# Patient Record
Sex: Male | Born: 1980 | Race: White | Hispanic: No | Marital: Single | Smoking: Former smoker
Health system: Southern US, Community
[De-identification: ages and names within clinical notes are randomized; demographics above are authoritative.]

## PROBLEM LIST (undated history)

## (undated) DIAGNOSIS — Z720 Tobacco use: Secondary | ICD-10-CM

## (undated) HISTORY — DX: Tobacco use: Z72.0

---

## 2018-02-01 ENCOUNTER — Encounter (HOSPITAL_BASED_OUTPATIENT_CLINIC_OR_DEPARTMENT_OTHER): Payer: Self-pay | Admitting: *Deleted

## 2018-02-01 ENCOUNTER — Other Ambulatory Visit: Payer: Self-pay

## 2018-02-01 ENCOUNTER — Emergency Department (HOSPITAL_BASED_OUTPATIENT_CLINIC_OR_DEPARTMENT_OTHER)
Admission: EM | Admit: 2018-02-01 | Discharge: 2018-02-02 | Disposition: A | Discharge status: Home / Self Care | Payer: Self-pay | Attending: Emergency Medicine | Admitting: Emergency Medicine

## 2018-02-01 DIAGNOSIS — R0602 Shortness of breath: Secondary | ICD-10-CM | POA: Insufficient documentation

## 2018-02-01 DIAGNOSIS — R55 Syncope and collapse: Secondary | ICD-10-CM | POA: Insufficient documentation

## 2018-02-01 DIAGNOSIS — F17210 Nicotine dependence, cigarettes, uncomplicated: Secondary | ICD-10-CM | POA: Insufficient documentation

## 2018-02-01 NOTE — ED Triage Notes (Signed)
Pt reports that he has had recurrent issues with getting lightheaded, SOB when getting hot x 1 year. Reports that he gets hot and then he gets lightheaded and SOB.  No acute distress noted.  States that he was seen and was dx with anxiety and panic attacks. Reports left arm tingling during episode.

## 2018-02-02 ENCOUNTER — Emergency Department (HOSPITAL_BASED_OUTPATIENT_CLINIC_OR_DEPARTMENT_OTHER): Payer: Self-pay

## 2018-02-02 LAB — CBC WITH DIFFERENTIAL/PLATELET
BASOS ABS: 0 10*3/uL (ref 0.0–0.1)
BASOS PCT: 0 %
Eosinophils Absolute: 0.3 10*3/uL (ref 0.0–0.7)
Eosinophils Relative: 3 %
HEMATOCRIT: 49 % (ref 39.0–52.0)
HEMOGLOBIN: 17.3 g/dL — AB (ref 13.0–17.0)
Lymphocytes Relative: 32 %
Lymphs Abs: 4.3 10*3/uL — ABNORMAL HIGH (ref 0.7–4.0)
MCH: 30 pg (ref 26.0–34.0)
MCHC: 35.3 g/dL (ref 30.0–36.0)
MCV: 85.1 fL (ref 78.0–100.0)
MONOS PCT: 7 %
Monocytes Absolute: 0.9 10*3/uL (ref 0.1–1.0)
NEUTROS ABS: 7.8 10*3/uL — AB (ref 1.7–7.7)
NEUTROS PCT: 58 %
Platelets: 225 10*3/uL (ref 150–400)
RBC: 5.76 MIL/uL (ref 4.22–5.81)
RDW: 13.2 % (ref 11.5–15.5)
WBC: 13.4 10*3/uL — ABNORMAL HIGH (ref 4.0–10.5)

## 2018-02-02 LAB — BASIC METABOLIC PANEL
ANION GAP: 9 (ref 5–15)
BUN: 13 mg/dL (ref 6–20)
CHLORIDE: 106 mmol/L (ref 101–111)
CO2: 23 mmol/L (ref 22–32)
Calcium: 9.1 mg/dL (ref 8.9–10.3)
Creatinine, Ser: 0.78 mg/dL (ref 0.61–1.24)
GFR calc non Af Amer: >60 mL/min (ref >60)
Glucose, Bld: 98 mg/dL (ref 65–99)
Potassium: 4.1 mmol/L (ref 3.5–5.1)
Sodium: 138 mmol/L (ref 135–145)

## 2018-02-02 NOTE — ED Notes (Signed)
Ambulatory back from xray, steady gait. Alert, NAD, calm, interactive, resps e/u, speaking in clear complete sentences, no dyspnea noted, skin W&D, VSS, reports earlier episode of "felt hot and sob", (denies: pain, sob, nausea, dizziness or visual changes at this time). [facility]

## 2018-02-02 NOTE — Discharge Instructions (Addendum)
You were seen today for episodes of almost passing out.  Your workup is reassuring.  Make sure to stay hydrated.  If you begin to feel hot or short of breath, you need to sit down.  Avoid getting overheated.

## 2018-02-02 NOTE — ED Provider Notes (Signed)
[facility] EMERGENCY DEPARTMENT Provider Note   CSN: [id number] Arrival date & time: 02/01/18  2147     History   Chief Complaint Chief Complaint  Patient presents with  . Shortness of Breath    HPI Ronald Allen is a 37 y.o. male.  HPI  This is a 37 year old male this is a 37 year old male who presents with near syncope.  Patient reports that he was at work tonight when he began to feel dizzy and hot.  He then felt lightheaded.  He states when he got this way he felt short of breath.  Symptoms have since resolved.  He has had multiple episodes like this in the past when he gets hot.  He denies passing out.  He denies chest pain.  He states that he has previously been told it might be "related to anxiety."  However, patient denies any feelings of anxiety at this time.  Denies weakness, numbness, strokelike symptoms.  Denies any recent illnesses or fevers.  History reviewed. No pertinent past medical history.  There are no active problems to display for this patient.   History reviewed. No pertinent surgical history.      Home Medications    Prior to Admission medications   Not on File    Family History History reviewed. No pertinent family history.  Social History Social History   Tobacco Use  . Smoking status: Current Every Day Smoker    Packs/day: 1.00    Types: Cigarettes  Substance Use Topics  . Alcohol use: Not Currently  . Drug use: Not Currently     Allergies   Patient has no known allergies.   Review of Systems Review of Systems  Constitutional: Negative for fever.  Respiratory: Positive for shortness of breath.   Cardiovascular: Negative for chest pain.  Gastrointestinal: Negative for abdominal pain, nausea and vomiting.  Genitourinary: Negative for dysuria.  Neurological: Positive for dizziness.  All other systems reviewed and are negative.    Physical Exam Updated Vital Signs BP (!) 124/59   Pulse 66   Temp 98 F (36.7 C)  (Oral)   Resp 20   Ht 5' 10.5" (1.791 m)   Wt 97.5 kg (215 lb)   SpO2 100%   BMI 30.41 kg/m    Physical Exam  Constitutional: He is oriented to person, place, and time. He appears well-developed and well-nourished.  HENT:  Head: Normocephalic and atraumatic.  Eyes: Pupils are equal, round, and reactive to light. EOM are normal.  Cardiovascular: Normal rate, regular rhythm and normal heart sounds.  No murmur heard. Pulmonary/Chest: Effort normal and breath sounds normal. No respiratory distress. He has no wheezes.  Abdominal: Soft. Bowel sounds are normal. There is no tenderness. There is no rebound.  Musculoskeletal: He exhibits no edema.  Neurological: He is alert and oriented to person, place, and time.  Cranial nerves II through XII intact, 5 out of 5 strength in all 4 extremities, no dysmetria to finger-nose-finger  Skin: Skin is warm and dry.  Psychiatric: He has a normal mood and affect.  Nursing note and vitals reviewed.    ED Treatments / Results  Labs (all labs ordered are listed, but only abnormal results are displayed) Labs Reviewed  CBC WITH DIFFERENTIAL/PLATELET - Abnormal; Notable for the following components:      Result Value   WBC 13.4 (*)    Hemoglobin 17.3 (*)    Neutro Abs 7.8 (*)    Lymphs Abs 4.3 (*)    All  other components within normal limits  BASIC METABOLIC PANEL    EKG EKG Interpretation  Date/Time:  Monday February 02 2018 00:38:33 EDT Ventricular Rate:  72 PR Interval:    QRS Duration: 94 QT Interval:  376 QTC Calculation: 412 R Axis:   40 Text Interpretation:  Sinus rhythm Low voltage, precordial leads Anteroseptal infarct, old no prior for comparison Confirmed by Ross Marcus ([id number]) on 02/02/2018 1:14:07 AM   Radiology Dg Chest 2 View  Result Date: 02/02/2018 CLINICAL DATA:  Acute onset of generalized chest tightness and shortness of breath. EXAM: CHEST - 2 VIEW COMPARISON:  None. FINDINGS: The lungs are well-aerated and clear.  There is no evidence of focal opacification, pleural effusion or pneumothorax. The heart is normal in size; the mediastinal contour is within normal limits. No acute osseous abnormalities are seen. IMPRESSION: No acute cardiopulmonary process seen. Electronically Signed   By: Roanna Raider M.D.   On: 02/02/2018 00:24    Procedures Procedures (including critical care time)  Medications Ordered in ED Medications - No data to display   Initial Impression / Assessment and Plan / ED Course  I have reviewed the triage vital signs and the nursing notes.  Pertinent labs & imaging results that were available during my care of the patient were reviewed by me and considered in my medical decision making (see chart for details).     Patient presents with near syncope.  Nontoxic appearing.  VS reassuring.  Neurologically intact.  EKG shows no evidence of arrythmia.  CBC without evidence of anemia.  He is not orthostatic.  Suspect vasovagal etiology.  Recommend hydration and avoiding getting overheated.  After history, exam, and medical workup I feel the patient has been appropriately medically screened and is safe for discharge home. Pertinent diagnoses were discussed with the patient. Patient was given return precautions.   Final Clinical Impressions(s) / ED Diagnoses   Final diagnoses:  Near syncope    ED Discharge Orders    None      Horton, Mayer Masker, MD 02/02/18 [phone]

## 2018-02-02 NOTE — ED Notes (Signed)
Alert, NAD, calm, interactive, resting comfortably[facility].

## 2018-02-23 ENCOUNTER — Institutional Professional Consult (permissible substitution): Payer: Self-pay | Admitting: Pulmonary Disease

## 2018-02-26 ENCOUNTER — Institutional Professional Consult (permissible substitution): Payer: Self-pay | Admitting: Pulmonary Disease

## 2018-05-19 ENCOUNTER — Emergency Department (HOSPITAL_BASED_OUTPATIENT_CLINIC_OR_DEPARTMENT_OTHER): Payer: Commercial Managed Care - PPO

## 2018-05-19 ENCOUNTER — Emergency Department (HOSPITAL_BASED_OUTPATIENT_CLINIC_OR_DEPARTMENT_OTHER)
Admission: EM | Admit: 2018-05-19 | Discharge: 2018-05-20 | Disposition: A | Discharge status: Home / Self Care | Payer: Commercial Managed Care - PPO | Attending: Emergency Medicine | Admitting: Emergency Medicine

## 2018-05-19 DIAGNOSIS — F17210 Nicotine dependence, cigarettes, uncomplicated: Secondary | ICD-10-CM | POA: Insufficient documentation

## 2018-05-19 DIAGNOSIS — R0602 Shortness of breath: Secondary | ICD-10-CM | POA: Diagnosis not present

## 2018-05-19 DIAGNOSIS — I10 Essential (primary) hypertension: Secondary | ICD-10-CM | POA: Diagnosis present

## 2018-05-19 NOTE — ED Triage Notes (Signed)
Pt also c/o brief episode of chest pain

## 2018-05-19 NOTE — ED Triage Notes (Signed)
Pt states he checked his blood pressure at work and got high reading of 160s- 170s systolic. Pt also c/o of a headache which has resolved. He also stated he has "breathing Troubles". Pt describes it as not being able to take a deep breath. NAD noted in triage

## 2018-05-19 NOTE — ED Notes (Signed)
Patient transported to X-ray 

## 2018-05-20 LAB — BASIC METABOLIC PANEL
ANION GAP: 9 (ref 5–15)
BUN: 10 mg/dL (ref 6–20)
CALCIUM: 8.9 mg/dL (ref 8.9–10.3)
CO2: 24 mmol/L (ref 22–32)
Chloride: 103 mmol/L (ref 98–111)
Creatinine, Ser: 0.93 mg/dL (ref 0.61–1.24)
GFR calc non Af Amer: >60 mL/min (ref >60)
GLUCOSE: 95 mg/dL (ref 70–99)
Potassium: 4.4 mmol/L (ref 3.5–5.1)
Sodium: 136 mmol/L (ref 135–145)

## 2018-05-20 LAB — CBC
HCT: 45.4 % (ref 39.0–52.0)
HEMOGLOBIN: 16.2 g/dL (ref 13.0–17.0)
MCH: 30 pg (ref 26.0–34.0)
MCHC: 35.7 g/dL (ref 30.0–36.0)
MCV: 84.1 fL (ref 78.0–100.0)
PLATELETS: 168 10*3/uL (ref 150–400)
RBC: 5.4 MIL/uL (ref 4.22–5.81)
RDW: 13.3 % (ref 11.5–15.5)
WBC: 10 10*3/uL (ref 4.0–10.5)

## 2018-05-20 LAB — TROPONIN I: Troponin I: <0.03 ng/mL (ref <0.03)

## 2018-05-20 NOTE — ED Provider Notes (Signed)
[facility] EMERGENCY DEPARTMENT Provider Note   CSN: [id number] Arrival date & time: 05/19/18  2304     History   Chief Complaint Chief Complaint  Patient presents with  . Hypertension  . Shortness of Breath    HPI Ronald Allen is a 37 y.o. male.  HPI  This is a 37 year old male with no reported past medical history who presents with concerns for high blood pressure.  Patient reports that he had a headache earlier today.  He has a wrist blood pressure monitor.  Noted his blood pressures in the 160s to 170s.  He took his blood pressure repeatedly over 30 minutes and it continued to go upwards.  This concerned him.  Currently he is without any symptoms.  Denies headache.  He reports 1 year history of shortness of breath.  He stopped smoking when he initially had shortness of breath.  He denies any chest pain.  Denies any leg swelling or history of blood clots.  Denies any fevers or recent infectious symptoms.  No past medical history on file.  There are no active problems to display for this patient.   No past surgical history on file.      Home Medications    Prior to Admission medications   Not on File    Family History No family history on file.  Social History Social History   Tobacco Use  . Smoking status: Current Every Day Smoker    Packs/day: 1.00    Types: Cigarettes  Substance Use Topics  . Alcohol use: Not Currently  . Drug use: Not Currently     Allergies   Patient has no known allergies.   Review of Systems Review of Systems  Constitutional: Negative for fever.  Respiratory: Positive for shortness of breath. Negative for cough.   Cardiovascular: Negative for chest pain and leg swelling.  Gastrointestinal: Negative for abdominal pain, nausea and vomiting.  Neurological: Negative for headaches.  All other systems reviewed and are negative.    Physical Exam Updated Vital Signs BP 123/87   Pulse 70   Temp 97.6 F (36.4 C)  (Oral)   Ht 5\' 10"  (1.778 m)   Wt 99.8 kg (220 lb)   SpO2 100%   BMI 31.57 kg/m   Physical Exam  Constitutional: He is oriented to person, place, and time. He appears well-developed and well-nourished.  HENT:  Head: Normocephalic and atraumatic.  Eyes: Pupils are equal, round, and reactive to light.  Neck: Neck supple.  Cardiovascular: Normal rate, regular rhythm and normal heart sounds.  No murmur heard. Pulmonary/Chest: Effort normal and breath sounds normal. No respiratory distress. He has no wheezes.  Abdominal: Soft. Bowel sounds are normal. There is no tenderness. There is no rebound.  Musculoskeletal: He exhibits no edema.  Lymphadenopathy:    He has no cervical adenopathy.  Neurological: He is alert and oriented to person, place, and time.  Skin: Skin is warm and dry.  Psychiatric: He has a normal mood and affect.  Nursing note and vitals reviewed.    ED Treatments / Results  Labs (all labs ordered are listed, but only abnormal results are displayed) Labs Reviewed  BASIC METABOLIC PANEL  CBC  TROPONIN I    EKG EKG Interpretation  Date/Time:  Tuesday May 19 2018 23:42:07 EDT Ventricular Rate:  70 PR Interval:    QRS Duration: 99 QT Interval:  385 QTC Calculation: 416 R Axis:   48 Text Interpretation:  Sinus rhythm Confirmed by Ross MarcusHorton, Caprina Wussow (  [id number]) on 05/19/2018 11:44:58 PM   Radiology Dg Chest 2 View  Result Date: 05/19/2018 CLINICAL DATA:  Chest pain and short of breath EXAM: CHEST - 2 VIEW COMPARISON:  02/02/2018 FINDINGS: The heart size and mediastinal contours are within normal limits. Both lungs are clear. Mild degenerative changes of the spine. IMPRESSION: No active cardiopulmonary disease. Electronically Signed   By: Jasmine Pang M.D.   On: 05/19/2018 23:53    Procedures Procedures (including critical care time)  Medications Ordered in ED Medications - No data to display   Initial Impression / Assessment and Plan / ED Course  I have  reviewed the triage vital signs and the nursing notes.  Pertinent labs & imaging results that were available during my care of the patient were reviewed by me and considered in my medical decision making (see chart for details).     Patient presents with concerns for high blood pressure.  He is overall nontoxic-appearing.Marland Kitchen  His blood pressure has been in normal range.  Initial blood pressure 123/87.  He reports no acute symptoms but does report chronic shortness of breath.  He reports having work-ups in the past.  He has stopped smoking.  He is in no respiratory distress.  O2 sats 91%.  No wheezing on exam.  Chest x-ray without pneumothorax or pneumonia.  EKG is nonischemic.  Work-up is largely reassuring. He is PERC neg doubt PE.  Recommend follow-up with PCP for ongoing chronic issue.  Doubt acute medical problem.  After history, exam, and medical workup I feel the patient has been appropriately medically screened and is safe for discharge home. Pertinent diagnoses were discussed with the patient. Patient was given return precautions.   Final Clinical Impressions(s) / ED Diagnoses   Final diagnoses:  Shortness of breath    ED Discharge Orders    None       Shon Baton, MD 05/20/18 [phone]

## 2018-05-20 NOTE — Discharge Instructions (Addendum)
You were seen today for evaluation of high blood pressure.  Your blood pressures here have been within normal range.  You need to see if your blood pressure monitor is calibrated correctly.  You have had ongoing shortness of breath.  Your x-ray is negative and your work-up is reassuring.  Follow-up closely with your primary physician for ongoing work-up.

## 2019-01-21 ENCOUNTER — Emergency Department
Admission: EM | Admit: 2019-01-21 | Discharge: 2019-01-21 | Disposition: A | Discharge status: Home / Self Care | Payer: BLUE CROSS/BLUE SHIELD | Attending: Emergency Medicine | Admitting: Emergency Medicine

## 2019-01-21 ENCOUNTER — Encounter: Payer: Self-pay | Admitting: Emergency Medicine

## 2019-01-21 ENCOUNTER — Other Ambulatory Visit: Payer: Self-pay

## 2019-01-21 DIAGNOSIS — R202 Paresthesia of skin: Secondary | ICD-10-CM | POA: Insufficient documentation

## 2019-01-21 DIAGNOSIS — Z87891 Personal history of nicotine dependence: Secondary | ICD-10-CM | POA: Diagnosis not present

## 2019-01-21 DIAGNOSIS — F410 Panic disorder [episodic paroxysmal anxiety] without agoraphobia: Secondary | ICD-10-CM | POA: Diagnosis not present

## 2019-01-21 DIAGNOSIS — R0602 Shortness of breath: Secondary | ICD-10-CM | POA: Diagnosis present

## 2019-01-21 DIAGNOSIS — R0789 Other chest pain: Secondary | ICD-10-CM | POA: Insufficient documentation

## 2019-01-21 NOTE — ED Triage Notes (Signed)
Pt here with c/o shortness of breath while at work this am, fatigue and dry throat. States while sitting in car he developed chest pain and tingling in his left arm-feels this could be related to anxiety. Denies cough or fever. NAD.

## 2019-01-21 NOTE — ED Notes (Signed)
Pt appears anxious, states he has a hx of the same. Denies tingling in his left arm at this time.

## 2019-01-21 NOTE — Discharge Instructions (Signed)
Your exam is reassuring at this time. You have symptoms that may represent a panic attack, causing shortness of breath and chest tightness. You are encouraged to follow-up with your provider or return to the ED as needed.

## 2019-01-21 NOTE — ED Provider Notes (Signed)
[facility] Emergency Department Provider Note  ____________________________________________   First MD Initiated Contact with Patient 01/21/19 1211     (approximate)  I have reviewed the triage vital signs and the nursing notes.   HISTORY  Chief Complaint Shortness of Breath and Chest Pain  HPI Ronald Allen is a 38 y.o. 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   He presents himself to the ED for evaluation of now resolved shortness of breath while at work this morning.  Patient describes awakening this morning in his normal state of health, and had breakfast without incident.  He also describes having a large 32 ounce cup of hot coffee.  He describes to sit in his car when he began to develop some chest tightness, with deep inspiration.  He also describes some feelings of tingling in his left arm.  He denies any sweating, nausea, vomiting, cough, or syncope.  He describes similar symptoms in the past, and has had work-ups for the same.  He is apparently been told that he has some anxiety after his work-ups were negative.  Patient denies any cough, fever, recent travel, or high risk exposures.  Patient reports the symptoms are completely resolved during the time of his triage and evaluation.  History reviewed. No pertinent past medical history.  There are no active problems to display for this patient.  History reviewed. No pertinent surgical history.  Prior to Admission medications   Not on File    Allergies Patient has no known allergies.  No family history on file.  Social History Social History   Tobacco Use  . Smoking status: Former Smoker    Packs/day: 1.00    Types: Cigarettes  . Smokeless tobacco: Never Used  . Tobacco comment: quit a year ago this month  Substance Use Topics  . Alcohol use: Not Currently  . Drug use: Not Currently    Review of Systems Constitutional: Denies fever, chills, or sweats. ENT: Denies  sore throat, otalgia, or sinus congestion. Cardiovascular: No chest pain. Respiratory: Reports shortness of breath Musculoskeletal: Negative for neck pain nor stiffness. Integumentary: Negative for rash. Neurological: No focal weakness nor numbness. ____________________________________________   PHYSICAL EXAM:  VITAL SIGNS: ED Triage Vitals  Enc Vitals Group     BP 01/21/19 1219 (!) 143/83     Pulse Rate 01/21/19 1213 96     Resp 01/21/19 1213 20     Temp 01/21/19 1217 98.3 F (36.8 C)     Temp Source 01/21/19 1213 Oral     SpO2 01/21/19 1213 100 %     Weight 01/21/19 1215 220 lb (99.8 kg)     Height 01/21/19 1215 5\' 10"  (1.778 m)     Head Circumference --      Peak Flow --      Pain Score 01/21/19 1214 1     Pain Loc --      Pain Edu? --      Excl. in GC? --     Constitutional: Alert and oriented. Generally well appearing and in no acute distress. Eyes: Conjunctivae are normal.  Neck: No stridor.  No meningeal signs.   Cardiovascular: Grossly normal heart sounds.  No murmurs, rubs, or gallops. Respiratory: No respiratory distress, no adventitious lung sounds. Musculoskeletal: No lower extremity tenderness nor edema. No gross deformities of extremities. Neurologic:  Normal speech and language. No gross focal neurologic deficits are appreciated.  Skin:  Skin is warm, dry and intact. No rash  noted.  No diaphoresis ____________________________________________   LABS (all labs ordered are listed, but only abnormal results are displayed)  Labs Reviewed - No data to display  ____________________________________________  RADIOLOGY  Official radiology report(s): No results found.  ____________________________________________  INITIAL IMPRESSION / MDM / ASSESSMENT AND PLAN / ED COURSE  As part of my medical decision making, I reviewed the following data within the electronic medical record.  Patient with ED evaluation of now resolved symptoms of shortness of breath  and some inspiratory chest tightness.  Patient is at this time stable without signs of any acute respiratory distress, acute coronary syndrome, or neurological deficits.  Patient was offered further evaluation related to his symptoms to confirm suspected diagnosis of anxiety related, noncardiac chest pain.  Patient verbalized his symptom relief with solution, and his assessment and agreement that her symptoms are likely related to an anxiety component point.  He is not inclined to proceed with further evaluation at this time.  We discussed again the only way to clearly rule out a cardiac cause would be with routine testing as he had received in the last 8 months.  Patient was given strict return precautions, including initiating the EMS for cardiac related complaints.  Patient verbalized understanding and is discharged to his care at this time to follow-up or return as discussed.  Patient's vital signs are stable.  This patient currently does not meet criteria for testing and I explained that in detail to the patient.  The evaluation today is reassuring with no evidence of emergent medical condition that requires further work-up or evaluation or inpatient treatment.  I provided follow-up recommendations and strict return precautions.  The patient understands and agrees with the plan. ____________________________________________  FINAL CLINICAL IMPRESSION(S) / ED DIAGNOSES  Final diagnoses:  Shortness of breath  Anxiety attack        Note:  This document was prepared using Dragon voice recognition software and may include unintentional dictation errors.    Lissa Hoard, PA-C 01/21/19 2010    Don Perking, [place], MD 01/22/19 [phone]

## 2019-08-10 ENCOUNTER — Other Ambulatory Visit: Payer: Self-pay

## 2019-08-10 ENCOUNTER — Encounter (HOSPITAL_COMMUNITY): Payer: Self-pay | Admitting: Emergency Medicine

## 2019-08-10 ENCOUNTER — Emergency Department (HOSPITAL_COMMUNITY): Payer: BC Managed Care – PPO

## 2019-08-10 DIAGNOSIS — Z87891 Personal history of nicotine dependence: Secondary | ICD-10-CM | POA: Diagnosis not present

## 2019-08-10 DIAGNOSIS — R002 Palpitations: Secondary | ICD-10-CM | POA: Diagnosis not present

## 2019-08-10 DIAGNOSIS — R0789 Other chest pain: Secondary | ICD-10-CM | POA: Diagnosis present

## 2019-08-10 LAB — CBC
HCT: 49.2 % (ref 39.0–52.0)
Hemoglobin: 16.6 g/dL (ref 13.0–17.0)
MCH: 29 pg (ref 26.0–34.0)
MCHC: 33.7 g/dL (ref 30.0–36.0)
MCV: 86 fL (ref 80.0–100.0)
Platelets: 297 10*3/uL (ref 150–400)
RBC: 5.72 MIL/uL (ref 4.22–5.81)
RDW: 12.6 % (ref 11.5–15.5)
WBC: 12.2 10*3/uL — ABNORMAL HIGH (ref 4.0–10.5)
nRBC: 0 % (ref 0.0–0.2)

## 2019-08-10 LAB — BASIC METABOLIC PANEL
Anion gap: 8 (ref 5–15)
BUN: 12 mg/dL (ref 6–20)
CO2: 26 mmol/L (ref 22–32)
Calcium: 9.3 mg/dL (ref 8.9–10.3)
Chloride: 101 mmol/L (ref 98–111)
Creatinine, Ser: 0.9 mg/dL (ref 0.61–1.24)
GFR calc Af Amer: >60 mL/min (ref >60)
GFR calc non Af Amer: >60 mL/min (ref >60)
Glucose, Bld: 140 mg/dL — ABNORMAL HIGH (ref 70–99)
Potassium: 3.5 mmol/L (ref 3.5–5.1)
Sodium: 135 mmol/L (ref 135–145)

## 2019-08-10 LAB — TROPONIN I (HIGH SENSITIVITY): Troponin I (High Sensitivity): <2 ng/L (ref <18)

## 2019-08-10 NOTE — ED Triage Notes (Signed)
Pt arrives via POV w/complaints of chest pain that started at 0730, pt states his heart was pounding, he was breathing heavy & he became sweaty. Pt denies radiating chest pain. Pt states he became nauseous after the episode. Pt states he was diagnosed w/vertigo on Monday.

## 2019-08-11 ENCOUNTER — Emergency Department (HOSPITAL_COMMUNITY)
Admission: EM | Admit: 2019-08-11 | Discharge: 2019-08-11 | Disposition: A | Discharge status: Home / Self Care | Payer: BC Managed Care – PPO | Attending: Emergency Medicine | Admitting: Emergency Medicine

## 2019-08-11 DIAGNOSIS — R0789 Other chest pain: Secondary | ICD-10-CM

## 2019-08-11 DIAGNOSIS — R002 Palpitations: Secondary | ICD-10-CM

## 2019-08-11 LAB — TROPONIN I (HIGH SENSITIVITY): Troponin I (High Sensitivity): <2 ng/L (ref <18)

## 2019-08-11 NOTE — Discharge Instructions (Signed)
Please call the cardiologist's office here at the hospital toget an appointment to be evaluated by the cardiologist for your episodes of racing heart.  Drink plenty of fluids.  Avoid caffeine products.  Recheck if you feel worse.

## 2019-08-11 NOTE — ED Provider Notes (Signed)
[facility] EMERGENCY DEPARTMENT Provider Note   CSN: [id number] Arrival date & time: 08/10/19  1959   Time seen 2:36 AM  History   Chief Complaint Chief Complaint  Patient presents with  . Chest Pain    HPI Juliet Rudedwin Gola is a 38 y.o. male.     HPI patient states he got off work about 7:30 AM on October 20.  He states he "had an attack".  He felt like his heart was racing and he felt short of breath.  He got nauseated and diaphoretic.  He had hot flashes.  He states he drove himself to the ED and sat in the parking lot and was able to calm himself down.  He states the episode lasted about 35 minutes.  He states however he has not felt right the rest of the day.  He states he feels very fatigued.  He has had chest pain in the center of his chest since 730 yesterday morning.  He states changing positions makes it hurt, nothing makes it feel better.  He denies cough but he has had shortness of breath off and on mainly with exertion.  He denies wheezing, fever, or chills.  He has had nausea without vomiting or diarrhea.  He states things are tasting funny but he has normal smell.Marland Kitchen.  He states he has been having episodes like this before in the past year.  He states he can go several months and then he will have episodes frequently.  He states tonight was the longest he has ever had it in the first time he had lingering chest pain after it.  He states he had a Covid test done last week that was negative because he had lost his since of smell and taste.  Family history he is unaware of any coronary artery disease or cardiac problems in his family.  Patient denies drinking caffeine products.  PCP Patient, No Pcp Per   History reviewed. No pertinent past medical history.  There are no active problems to display for this patient.   History reviewed. No pertinent surgical history.      Home Medications    Prior to Admission medications   Not on File    Family History History reviewed.  No pertinent family history.  Social History Social History   Tobacco Use  . Smoking status: Former Smoker    Packs/day: 1.00    Types: Cigarettes  . Smokeless tobacco: Never Used  . Tobacco comment: quit a year ago this month  Substance Use Topics  . Alcohol use: Not Currently  . Drug use: Not Currently  employed Quit smoking 1 year ago  Allergies   Patient has no known allergies.   Review of Systems Review of Systems  All other systems reviewed and are negative.    Physical Exam Updated Vital Signs BP 105/72 (BP Location: Right Arm)   Pulse 70   Temp 98.2 F (36.8 C) (Oral)   Resp 18   Ht 5' 10.5" (1.791 m)   Wt 97.5 kg   SpO2 99%   BMI 30.41 kg/m   Physical Exam Vitals signs and nursing note reviewed.  Constitutional:      General: He is not in acute distress.    Appearance: Normal appearance. He is well-developed. He is not ill-appearing or toxic-appearing.  HENT:     Head: Normocephalic and atraumatic.     Right Ear: External ear normal.     Left Ear: External ear normal.  Nose: Nose normal. No mucosal edema or rhinorrhea.     Mouth/Throat:     Dentition: No dental abscesses.     Pharynx: No uvula swelling.  Eyes:     Conjunctiva/sclera: Conjunctivae normal.     Pupils: Pupils are equal, round, and reactive to light.  Neck:     Musculoskeletal: Full passive range of motion without pain, normal range of motion and neck supple.  Cardiovascular:     Rate and Rhythm: Normal rate and regular rhythm.     Heart sounds: Normal heart sounds. No murmur. No friction rub. No gallop.   Pulmonary:     Effort: Pulmonary effort is normal. No respiratory distress.     Breath sounds: Normal breath sounds. No wheezing, rhonchi or rales.  Chest:     Chest wall: No tenderness or crepitus.       Comments: Area of chest pain noted Abdominal:     General: Bowel sounds are normal. There is no distension.     Palpations: Abdomen is soft.     Tenderness: There is  no abdominal tenderness. There is no guarding or rebound.  Musculoskeletal: Normal range of motion.        General: No tenderness.     Comments: Moves all extremities well.   Skin:    General: Skin is warm and dry.     Coloration: Skin is not pale.     Findings: No erythema or rash.  Neurological:     Mental Status: He is alert and oriented to person, place, and time.     Cranial Nerves: No cranial nerve deficit.  Psychiatric:        Mood and Affect: Mood is not anxious.        Speech: Speech normal.        Behavior: Behavior normal.      ED Treatments / Results  Labs (all labs ordered are listed, but only abnormal results are displayed) Results for orders placed or performed during the hospital encounter of 08/11/19  Basic metabolic panel  Result Value Ref Range   Sodium 135 135 - 145 mmol/L   Potassium 3.5 3.5 - 5.1 mmol/L   Chloride 101 98 - 111 mmol/L   CO2 26 22 - 32 mmol/L   Glucose, Bld 140 (H) 70 - 99 mg/dL   BUN 12 6 - 20 mg/dL   Creatinine, Ser 7.51 0.61 - 1.24 mg/dL   Calcium 9.3 8.9 - 70.0 mg/dL   GFR calc non Af Amer >60 >60 mL/min   GFR calc Af Amer >60 >60 mL/min   Anion gap 8 5 - 15  CBC  Result Value Ref Range   WBC 12.2 (H) 4.0 - 10.5 K/uL   RBC 5.72 4.22 - 5.81 MIL/uL   Hemoglobin 16.6 13.0 - 17.0 g/dL   HCT 17.4 94.4 - 96.7 %   MCV 86.0 80.0 - 100.0 fL   MCH 29.0 26.0 - 34.0 pg   MCHC 33.7 30.0 - 36.0 g/dL   RDW 59.1 63.8 - 46.6 %   Platelets 297 150 - 400 K/uL   nRBC 0.0 0.0 - 0.2 %  Troponin I (High Sensitivity)  Result Value Ref Range   Troponin I (High Sensitivity) <2.0 <18 ng/L  Troponin I (High Sensitivity)  Result Value Ref Range   Troponin I (High Sensitivity) <2.0 <18 ng/L   Laboratory interpretation all normal except mild leukocytosis, mild hyperglycemia and nonfasting patient, delta troponin are normal    EKG EKG Interpretation  Date/Time:  Tuesday August 10 2019 20:16:52 EDT Ventricular Rate:  91 PR Interval:  178 QRS  Duration: 106 QT Interval:  364 QTC Calculation: 447 R Axis:   -10 Text Interpretation:  Normal sinus rhythm Low voltage QRS Incomplete right bundle branch block Cannot rule out Anteroseptal infarct , age undetermined No significant change since last tracing 19 May 2018 Confirmed by Rolland Porter [phone]) on 08/11/2019 1:06:21 AM   Radiology Dg Chest 2 View  Result Date: 08/10/2019 CLINICAL DATA:  Chest pain EXAM: CHEST - 2 VIEW COMPARISON:  Radiograph 05/19/2018, 02/02/2018 FINDINGS: Mild airways thickening. No consolidation, features of edema, pneumothorax, or effusion. Pulmonary vascularity is normally distributed. The cardiomediastinal contours are unremarkable. No acute osseous or soft tissue abnormality. IMPRESSION: Mild airways thickening could reflect bronchitis or reactive airways disease. No other acute cardiopulmonary abnormality. Electronically Signed   By: Lovena Le M.D.   On: 08/10/2019 20:41    Procedures Procedures (including critical care time)  Medications Ordered in ED Medications - No data to display   Initial Impression / Assessment and Plan / ED Course  I have reviewed the triage vital signs and the nursing notes.  Pertinent labs & imaging results that were available during my care of the patient were reviewed by me and considered in my medical decision making (see chart for details).   Patient has an appointment next week with a specialist about vertigo.   Patient describes episodes intermittently of palpitations.  I am going to refer him to cardiology.  His laboratory testing tonight is nonrevealing.  Final Clinical Impressions(s) / ED Diagnoses   Final diagnoses:  Palpitations  Atypical chest pain    ED Discharge Orders    None     Plan discharge  Rolland Porter, MD, Barbette Or, MD 08/11/19 [phone]

## 2020-02-19 ENCOUNTER — Encounter (HOSPITAL_BASED_OUTPATIENT_CLINIC_OR_DEPARTMENT_OTHER): Payer: Self-pay | Admitting: Emergency Medicine

## 2020-02-19 ENCOUNTER — Emergency Department (HOSPITAL_BASED_OUTPATIENT_CLINIC_OR_DEPARTMENT_OTHER)
Admission: EM | Admit: 2020-02-19 | Discharge: 2020-02-19 | Disposition: A | Discharge status: Home / Self Care | Payer: Commercial Managed Care - PPO | Attending: Emergency Medicine | Admitting: Emergency Medicine

## 2020-02-19 ENCOUNTER — Other Ambulatory Visit: Payer: Self-pay

## 2020-02-19 DIAGNOSIS — R42 Dizziness and giddiness: Secondary | ICD-10-CM | POA: Insufficient documentation

## 2020-02-19 DIAGNOSIS — Z20822 Contact with and (suspected) exposure to covid-19: Secondary | ICD-10-CM | POA: Diagnosis not present

## 2020-02-19 DIAGNOSIS — Z87891 Personal history of nicotine dependence: Secondary | ICD-10-CM | POA: Insufficient documentation

## 2020-02-19 LAB — BASIC METABOLIC PANEL
Anion gap: 12 (ref 5–15)
BUN: 16 mg/dL (ref 6–20)
CO2: 25 mmol/L (ref 22–32)
Calcium: 9.5 mg/dL (ref 8.9–10.3)
Chloride: 101 mmol/L (ref 98–111)
Creatinine, Ser: 0.91 mg/dL (ref 0.61–1.24)
GFR calc Af Amer: >60 mL/min (ref >60)
GFR calc non Af Amer: >60 mL/min (ref >60)
Glucose, Bld: 93 mg/dL (ref 70–99)
Potassium: 3.8 mmol/L (ref 3.5–5.1)
Sodium: 138 mmol/L (ref 135–145)

## 2020-02-19 LAB — CBC
HCT: 47.3 % (ref 39.0–52.0)
Hemoglobin: 16.6 g/dL (ref 13.0–17.0)
MCH: 29.3 pg (ref 26.0–34.0)
MCHC: 35.1 g/dL (ref 30.0–36.0)
MCV: 83.4 fL (ref 80.0–100.0)
Platelets: 254 10*3/uL (ref 150–400)
RBC: 5.67 MIL/uL (ref 4.22–5.81)
RDW: 12.8 % (ref 11.5–15.5)
WBC: 9.4 10*3/uL (ref 4.0–10.5)
nRBC: 0 % (ref 0.0–0.2)

## 2020-02-19 LAB — SARS CORONAVIRUS 2 AG (30 MIN TAT): SARS Coronavirus 2 Ag: NEGATIVE

## 2020-02-19 MED ORDER — SODIUM CHLORIDE 0.9 % IV BOLUS
1000.0000 mL | Freq: Once | INTRAVENOUS | Status: AC
  Administered 2020-02-19: 1000 mL via INTRAVENOUS

## 2020-02-19 NOTE — ED Triage Notes (Signed)
Dizziness with movement x 1 week. Denies pain.

## 2020-02-19 NOTE — ED Notes (Signed)
Pt discharged to home. Discharge instructions have been discussed with patient and/or family members. Pt verbally acknowledges understanding d/c instructions, and endorses comprehension to checkout at registration before leaving.  °

## 2020-02-19 NOTE — ED Provider Notes (Signed)
[facility] EMERGENCY DEPARTMENT Provider Note  CSN: [id number] Arrival date & time: 02/19/20  5462     History Chief Complaint  Patient presents with  . Dizziness    Ronald Allen is a 39 y.o. male with no significant past medical history presents ED with complaint of lightheadedness.  Patient reports he is noted his symptoms for approximately 2 weeks.  He said it was gradual onset initially.  But he has noted more persistent symptoms of lightheadedness.  These are associated mostly with activity but also occur while completely at rest.  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  He said he felt like he had "brain fog and dizziness" and had to stop what he was doing.  He denies syncope.  He denies having chest pain or palpitations.   He has never had prior episodes like this in the past.  No neck pain, fever or headaches. He DENIES vertigo symptoms, but states his lightheadedness sometimes worsens when he "moves too quickly."  He denies personal hx of MI or cardiac disease.  Mother had MI at later age.  No family hx of sudden death.  He quit smoking 2 years ago.  Does not drink alcohol or use any recreational drugs including THC.  NKDA He has not received COVID vaccination yet.  No hemoptysis or asymmetric LE edema. Patient denies personal or family history of DVT or PE. No recent hormone use (including OCP); travel for >6 hours; prolonged immobilization for greater than 3 days; surgeries or trauma in the last 4 weeks; or malignancy with treatment within 6 months.   HPI     History reviewed. No pertinent past medical history.  There are no problems to display for this patient.   History reviewed. No pertinent surgical history.     No family history on file.  Social History   Tobacco Use  . Smoking status: Former Smoker    Packs/day: 1.00    Types: Cigarettes  . Smokeless tobacco: Never Used  .  Tobacco comment: quit a year ago this month  Substance Use Topics  . Alcohol use: Not Currently  . Drug use: Not Currently    Home Medications Prior to Admission medications   Not on File    Allergies    Patient has no known allergies.  Review of Systems   Review of Systems  Constitutional: Negative for chills and fever.  Eyes: Negative for pain and visual disturbance.  Respiratory: Negative for cough and shortness of breath.   Cardiovascular: Negative for chest pain and palpitations.  Gastrointestinal: Negative for abdominal pain, nausea and vomiting.  Musculoskeletal: Negative for arthralgias, neck pain and neck stiffness.  Skin: Negative for rash and wound.  Neurological: Positive for dizziness and light-headedness. Negative for seizures, syncope, speech difficulty, weakness, numbness and headaches.  Psychiatric/Behavioral: Negative for agitation and confusion.  All other systems reviewed and are negative.   Physical Exam Updated Vital Signs BP 118/73 (BP Location: Right Arm)   Pulse 72   Temp 97.6 F (36.4 C)   Resp 18   Ht 5\' 10"  (1.778 m)   Wt 94.3 kg   SpO2 99%   BMI 29.84 kg/m   Physical Exam Vitals and nursing note reviewed.  Constitutional:      Appearance: He is well-developed.  HENT:     Head: Normocephalic and atraumatic.  Eyes:     Conjunctiva/sclera: Conjunctivae normal.  Cardiovascular:     Rate and Rhythm: Normal rate and regular rhythm.     Pulses: Normal pulses.  Pulmonary:     Effort: Pulmonary effort is normal. No respiratory distress.     Breath sounds: Normal breath sounds.  Abdominal:     Palpations: Abdomen is soft.     Tenderness: There is no abdominal tenderness.  Musculoskeletal:     Cervical back: Neck supple.  Skin:    General: Skin is warm and dry.  Neurological:     General: No focal deficit present.     Mental Status: He is alert and oriented to person, place, and time. Mental status is at baseline.     Cranial Nerves:  No cranial nerve deficit.     Sensory: Sensory deficit present.     Gait: Gait normal.  Psychiatric:        Mood and Affect: Mood normal.        Behavior: Behavior normal.     ED Results / Procedures / Treatments   Labs (all labs ordered are listed, but only abnormal results are displayed) Labs Reviewed  SARS CORONAVIRUS 2 AG (30 MIN TAT)  BASIC METABOLIC PANEL  CBC    EKG EKG Interpretation  Date/Time:  Saturday Feb 19 2020 08:28:41 EDT Ventricular Rate:  72 PR Interval:    QRS Duration: 99 QT Interval:  379 QTC Calculation: 415 R Axis:   20 Text Interpretation: Sinus rhythm Low voltage, precordial leads No STEMI Confirmed by Octaviano Glow [id number]) on 02/19/2020 8:48:05 AM   Radiology No results found.  Procedures Procedures (including critical care time)  Medications Ordered in ED Medications  sodium chloride 0.9 % bolus 1,000 mL (0 mLs Intravenous Stopped 02/19/20 1028)    ED Course  I have reviewed the triage vital signs and the nursing notes.  Pertinent labs & imaging results that were available during my care of the patient were reviewed by me and considered in my medical decision making (see chart for details).  39 year old male presenting to the ED with episodes of lightheadedness for the past 2 weeks.    He has a benign cardiopulmonary and neurological exam today.  No nystagmus.  No neck pain or history of trauma to suggest vascular dissection.   Without persistent symptoms or any clear risk factors, this seems less likely a posterior stroke or cerebellar infarct.  No evidence of arrhythmia on ecg here, but cannot exclude the possibility.  Will check electrolytes. Will also check CBC for anemia ECG shows NSR, no sign of heart block or prolonged QTc or channelopathy on this ECG.  No STEMI or evidence of ischemia.  He is PERC negative, with no tachycardia or active respiratory symptoms.  I think this is less likely a PE.  Ronald Allen was evaluated in  Emergency Department on 02/19/2020 for the symptoms described in the history of present illness. He was evaluated in the context of the global COVID-19 pandemic, which necessitated consideration that the patient might be at risk for infection with the SARS-CoV-2 virus that causes COVID-19. Institutional protocols and algorithms that pertain to the evaluation of patients at risk for COVID-19 are in a state of rapid change based on information released by regulatory bodies including the CDC and federal and state organizations. These policies and algorithms were followed during the patient's care in the ED.   Clinical Course as of Feb 19 1632  Sat Feb 19, 2020  0940 Chemistry machine is not functioning - we'll obtain an  istat chem 8   [MT]  1132 Patient is feeling much better after his fluids.  He is asymptomatic. I reviewed his lab tests and ECG with him.  He tells to me that he has been taking a "anti-parasite" medication he bought OTC for about a week.  He asks if this could be causing his symptoms.  I told him yes, certainly possible.  Many over-the-counter home remedies or medications are not FDA regulated, and is not clear what may be in this medicine.  Advised him to stop taking this substance and monitor symptoms.   [MT]    Clinical Course User Index [MT] Ricquel Foulk, Kermit Balo, MD    Final Clinical Impression(s) / ED Diagnoses Final diagnoses:  Lightheadedness    Rx / DC Orders ED Discharge Orders    None       Terald Sleeper, MD 02/19/20 [phone]

## 2021-07-01 IMAGING — DX DG CHEST 2V
2 series · 2 of 2 positions shown · non-contrast
Comparison: Radiograph 05/19/2018, 02/02/2018

CLINICAL DATA: Chest pain

EXAM:
CHEST - 2 VIEW

[chest pa]
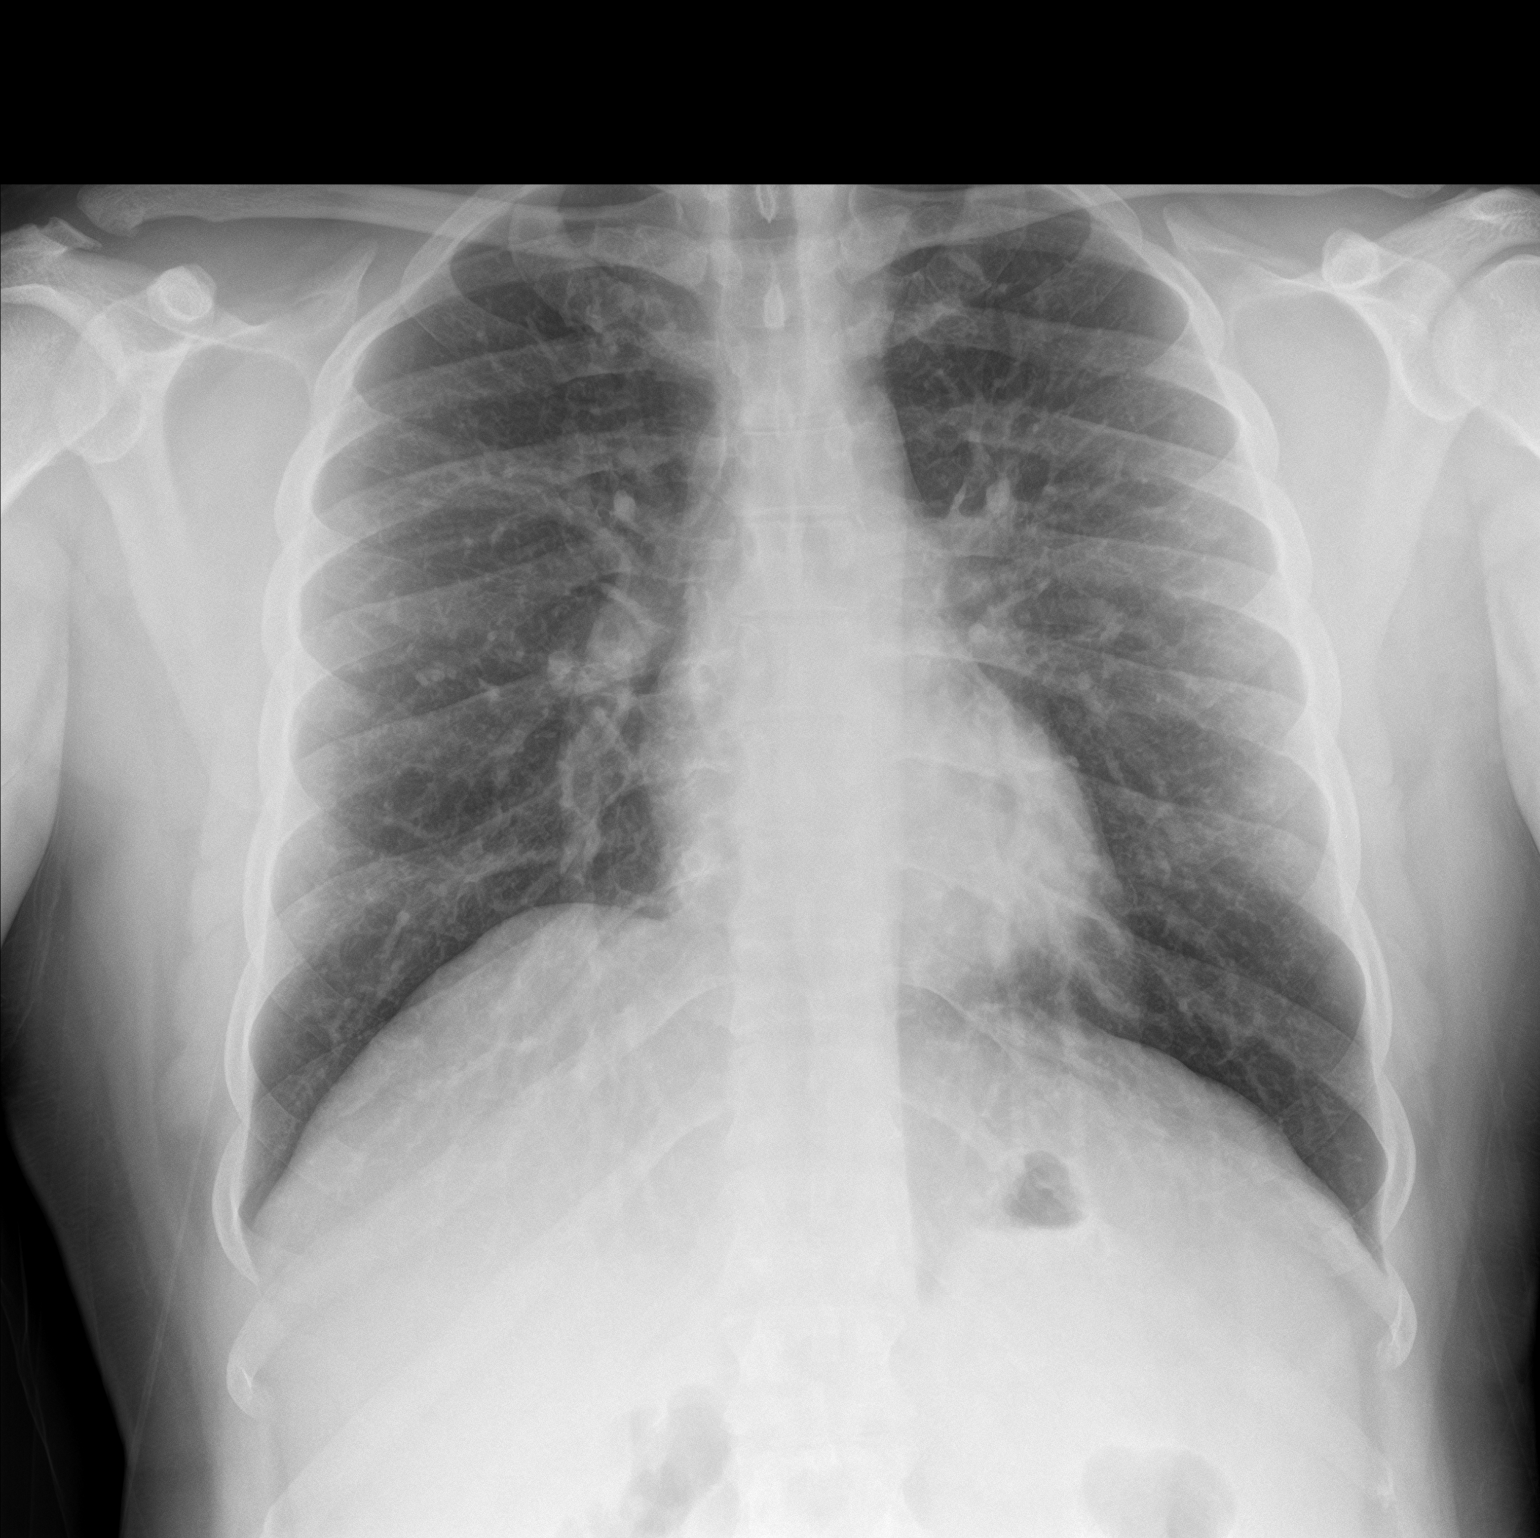

[chest lat]
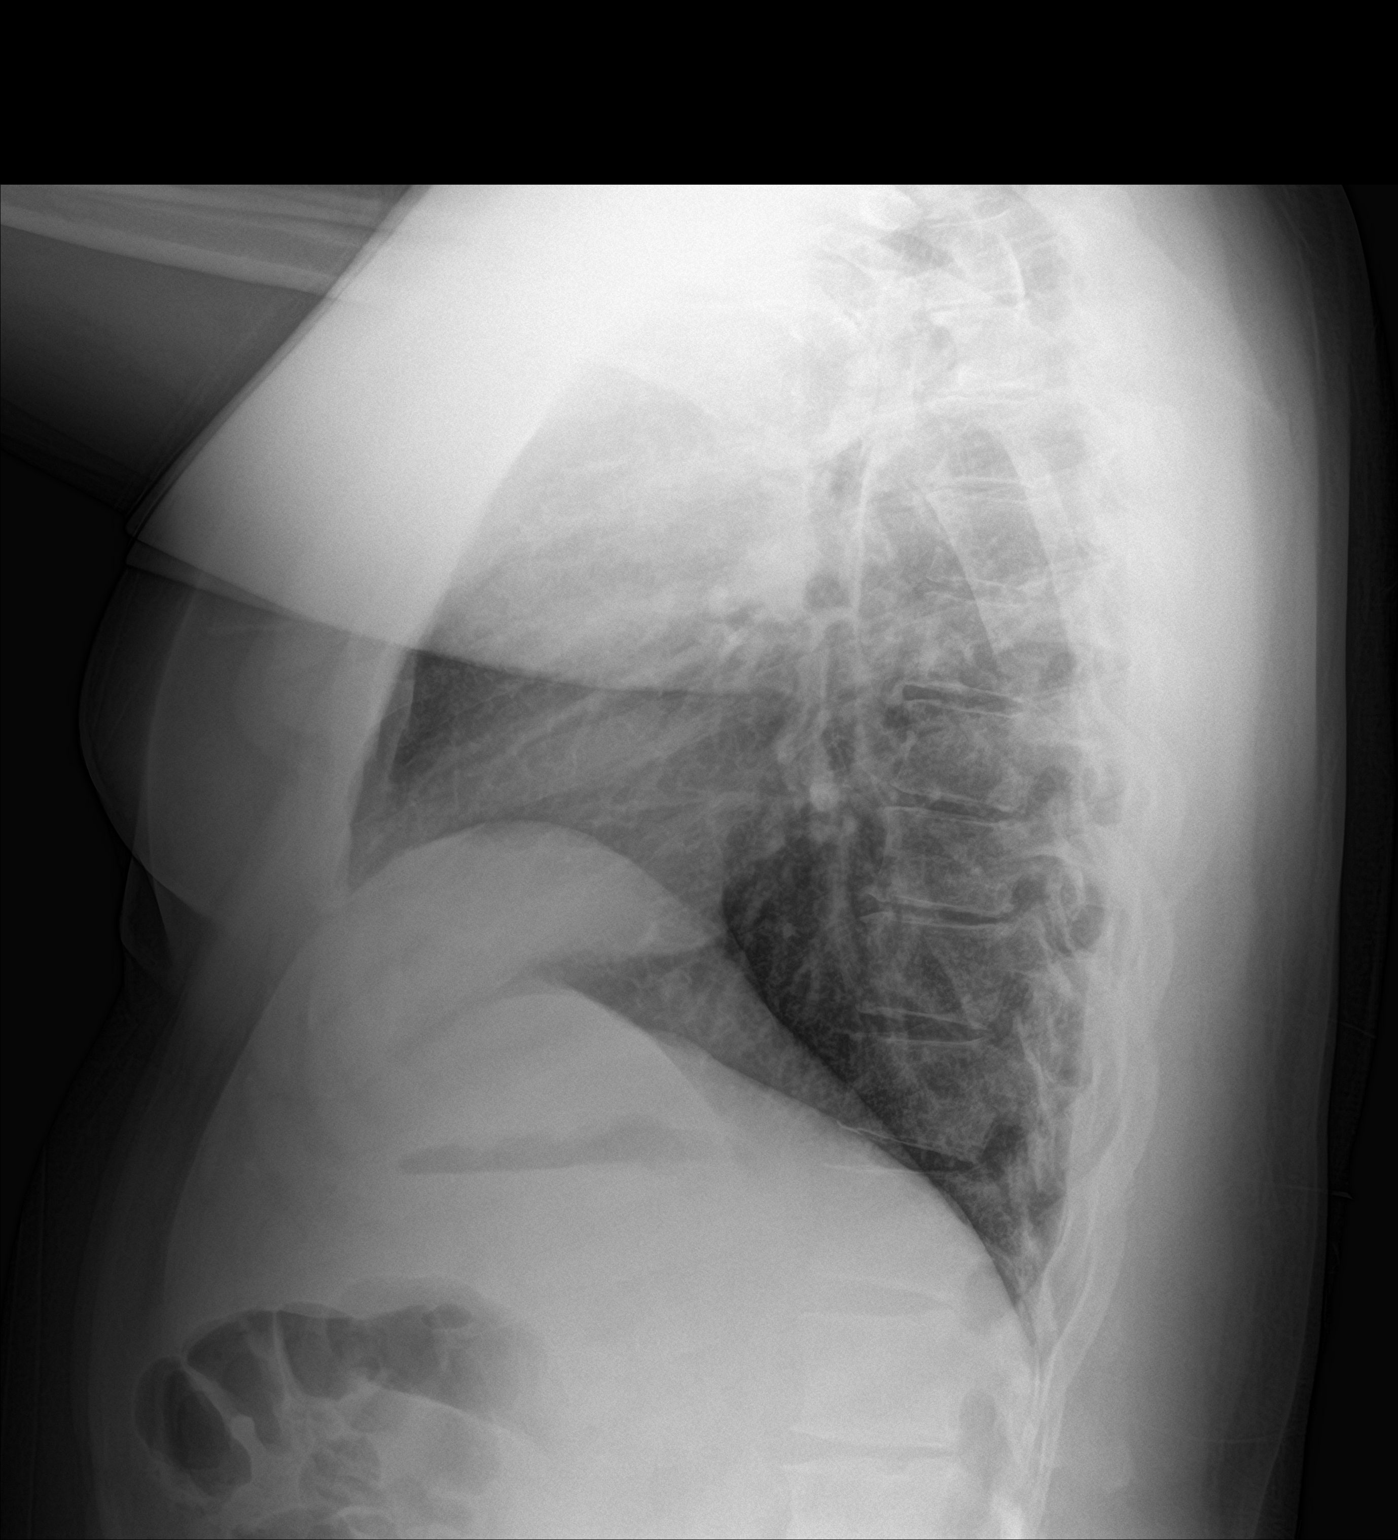

[2 of 2 positions shown; findings below may reference images not displayed]

FINDINGS: Mild airways thickening. No consolidation, features of edema,
pneumothorax, or effusion. Pulmonary vascularity is normally
distributed. The cardiomediastinal contours are unremarkable. No
acute osseous or soft tissue abnormality.
IMPRESSION: Mild airways thickening could reflect bronchitis or reactive airways
disease.

No other acute cardiopulmonary abnormality.

## 2022-02-16 ENCOUNTER — Emergency Department: Payer: BC Managed Care – PPO

## 2022-02-16 ENCOUNTER — Encounter: Payer: Self-pay | Admitting: Emergency Medicine

## 2022-02-16 ENCOUNTER — Other Ambulatory Visit: Payer: Self-pay

## 2022-02-16 ENCOUNTER — Emergency Department
Admission: EM | Admit: 2022-02-16 | Discharge: 2022-02-17 | Disposition: A | Discharge status: Home / Self Care | Payer: BC Managed Care – PPO | Attending: Emergency Medicine | Admitting: Emergency Medicine

## 2022-02-16 DIAGNOSIS — R0789 Other chest pain: Secondary | ICD-10-CM | POA: Insufficient documentation

## 2022-02-16 DIAGNOSIS — R0602 Shortness of breath: Secondary | ICD-10-CM | POA: Insufficient documentation

## 2022-02-16 DIAGNOSIS — R000 Tachycardia, unspecified: Secondary | ICD-10-CM | POA: Insufficient documentation

## 2022-02-16 DIAGNOSIS — R079 Chest pain, unspecified: Secondary | ICD-10-CM | POA: Diagnosis not present

## 2022-02-16 LAB — CBC
HCT: 50.4 % (ref 39.0–52.0)
Hemoglobin: 16.4 g/dL (ref 13.0–17.0)
MCH: 27.9 pg (ref 26.0–34.0)
MCHC: 32.5 g/dL (ref 30.0–36.0)
MCV: 85.9 fL (ref 80.0–100.0)
Platelets: 250 10*3/uL (ref 150–400)
RBC: 5.87 MIL/uL — ABNORMAL HIGH (ref 4.22–5.81)
RDW: 13.9 % (ref 11.5–15.5)
WBC: 14 10*3/uL — ABNORMAL HIGH (ref 4.0–10.5)
nRBC: 0 % (ref 0.0–0.2)

## 2022-02-16 LAB — BRAIN NATRIURETIC PEPTIDE: B Natriuretic Peptide: 15.8 pg/mL (ref 0.0–100.0)

## 2022-02-16 LAB — BASIC METABOLIC PANEL
Anion gap: 8 (ref 5–15)
BUN: 17 mg/dL (ref 6–20)
CO2: 25 mmol/L (ref 22–32)
Calcium: 9.2 mg/dL (ref 8.9–10.3)
Chloride: 103 mmol/L (ref 98–111)
Creatinine, Ser: 0.93 mg/dL (ref 0.61–1.24)
GFR, Estimated: >60 mL/min (ref >60)
Glucose, Bld: 89 mg/dL (ref 70–99)
Potassium: 3.6 mmol/L (ref 3.5–5.1)
Sodium: 136 mmol/L (ref 135–145)

## 2022-02-16 LAB — TROPONIN I (HIGH SENSITIVITY)
Troponin I (High Sensitivity): 4 ng/L (ref <18)
Troponin I (High Sensitivity): 4 ng/L (ref <18)

## 2022-02-16 MED ORDER — SODIUM CHLORIDE 0.9 % IV BOLUS: 1000.0000 mL | Freq: Once | INTRAVENOUS | Status: DC

## 2022-02-16 MED ORDER — IOHEXOL 350 MG/ML SOLN
80.0000 mL | Freq: Once | INTRAVENOUS | Status: AC | PRN
  Administered 2022-02-16: 50 mL via INTRAVENOUS

## 2022-02-16 MED ORDER — SODIUM CHLORIDE 0.9 % IV BOLUS
500.0000 mL | Freq: Once | INTRAVENOUS | Status: AC
  Administered 2022-02-16: 500 mL via INTRAVENOUS

## 2022-02-16 NOTE — ED Notes (Signed)
Attempted IV start x2 without success  

## 2022-02-16 NOTE — ED Triage Notes (Signed)
Pt here for acute onset of chest pain/SHOB that started 20 min ago.  No recent illness or other sx.  Pain is to center of chest and mild.  Has had some mild breathing issues over last couple years but without dx per pt.  No fever. Unlabored but mild tachypnea noted. ?

## 2022-02-16 NOTE — ED Provider Notes (Signed)
? ?[facility] ?Provider Note ? ? ? Event Date/Time  ? First MD Initiated Contact with Patient 02/16/22 2111   ?  (approximate) ? ? ?History  ? ?Chest Pain and Shortness of Breath ? ? ?HPI ? ?Ronald Allen is a 41 y.o. male here with chest pain shortness of breath.  The patient states that he was driving his car to work today.  He experienced acute onset of severe shortness of breath.  He suddenly felt like he could not catch his breath.  He states that he has had episodes somewhat similar to this actually for the last several years, this is significantly the most severe.  He states he had some mild pleuritic pain with the episode today.  He tried to pull over to rest, and also pulled into a gas station to see if drink water would help but it did not.  He subsequent presents for evaluation.  He states that he continues to feel like he cannot get enough air.  Denies recent fevers or chills.  No recent sputum production.  No recent medication changes.  No history of COPD or asthma. ?  ? ? ?Physical Exam  ? ?Triage Vital Signs: ?ED Triage Vitals  ?Enc Vitals Group  ?   BP 02/16/22 1956 136/81  ?   Pulse Rate 02/16/22 1956 (!) 105  ?   Resp 02/16/22 1956 (!) 24  ?   Temp 02/16/22 2004 98.6 ?F (37 ?C)  ?   Temp Source 02/16/22 2004 Oral  ?   SpO2 02/16/22 1956 97 %  ?   Weight 02/16/22 1956 220 lb (99.8 kg)  ?   Height 02/16/22 1956 5\' 11"  (1.803 m)  ?   Head Circumference --   ?   Peak Flow --   ?   Pain Score 02/16/22 1956 2  ?   Pain Loc --   ?   Pain Edu? --   ?   Excl. in GC? --   ? ? ?Most recent vital signs: ?Vitals:  ? 02/16/22 2130 02/17/22 0027  ?BP: (!) 147/86 135/88  ?Pulse: (!) 103 91  ?Resp:  16  ?Temp:    ?SpO2: 94% 97%  ? ? ? ?General: Awake, no distress.  ?CV:  Good peripheral perfusion.  Mild tachycardia, no murmurs. ?Resp:  Normal effort.  Lungs clear to auscultation bilaterally.  No wheezes or rales. ?Abd:  No distention.  No tenderness. ?Other:  No leg asymmetry.  No calf  tenderness. ? ? ?ED Results / Procedures / Treatments  ? ?Labs ?(all labs ordered are listed, but only abnormal results are displayed) ?Labs Reviewed  ?CBC - Abnormal; Notable for the following components:  ?    Result Value  ? WBC 14.0 (*)   ? RBC 5.87 (*)   ? All other components within normal limits  ?BASIC METABOLIC PANEL  ?BRAIN NATRIURETIC PEPTIDE  ?TROPONIN I (HIGH SENSITIVITY)  ?TROPONIN I (HIGH SENSITIVITY)  ? ? ? ?EKG ?Sinus tachycardia, ventricular 106.  PR 168, QRS 84, QTc 4 9.  No acute ST elevations or depressions.  EKG evidence of acute ischemia or infarct. ? ? ?RADIOLOGY ?Chest x-ray: No active disease ? ? ?I also independently reviewed and agree with radiologist interpretations. ? ? ?PROCEDURES: ? ?Critical Care performed: No ? ?.1-3 Lead EKG Interpretation ?Performed by: 02/19/22, MD ?Authorized by: Shaune Pollack, MD  ? ?  Interpretation: abnormal   ?  ECG rate:  90-110 ?  ECG rate assessment:  tachycardic   ?  Rhythm: sinus tachycardia   ?  Ectopy: none   ?  Conduction: normal   ?Comments:  ?   Indication: Chest pain ? ? ? ?MEDICATIONS ORDERED IN ED: ?Medications  ?sodium chloride 0.9 % bolus 500 mL (0 mLs Intravenous Stopped 02/16/22 2359)  ?iohexol (OMNIPAQUE) 350 MG/ML injection 80 mL (50 mLs Intravenous Contrast Given 02/16/22 2329)  ? ? ? ?IMPRESSION / MDM / ASSESSMENT AND PLAN / ED COURSE  ?I reviewed the triage vital signs and the nursing notes. ?             ?               ? ? ?The patient is on the cardiac monitor to evaluate for evidence of arrhythmia and/or significant heart rate changes. ? ? ?Ddx:  ?Arrhythmia, PE, ACS, reactive airway disease, PNA, PTX, GERD with esophageal spasms, anxiety with panic attacks ? ? ?MDM:  ?Pleasant 41 yo M here with recurrent episodes of CP/SOB. Unclear etiology - pt states these have been ongoing x years, but increasingly frequent. Lungs are CTAB on exam. CXR clear. EKG nonischemic and troponin is negative, do not suspect ACS. CBC with mild  likely reactive leukocytosis, no fevers or infectious sx. BMP unremarkable. BNP normal, and he has no clinical signs of CHF. No murmurs on exam. CT Angio obtained, reviewed, and shows no signs of PE, pulm HTN, or other abnormality. Telemetry in ED shows no arrhythmia or ectopy. ? ?Unclear etiology of events, will refer for possible outpt echo/cardiac monitoring. Return precautions given. ? ? ?MEDICATIONS GIVEN IN ED: ?Medications  ?sodium chloride 0.9 % bolus 500 mL (0 mLs Intravenous Stopped 02/16/22 2359)  ?iohexol (OMNIPAQUE) 350 MG/ML injection 80 mL (50 mLs Intravenous Contrast Given 02/16/22 2329)  ? ? ? ?Consults:  ?None ? ? ?EMR reviewed  ?Prior ED visits for palpitations, SOB ? ? ? ? ?FINAL CLINICAL IMPRESSION(S) / ED DIAGNOSES  ? ?Final diagnoses:  ?SOB (shortness of breath)  ?Atypical chest pain  ? ? ? ?Rx / DC Orders  ? ?ED Discharge Orders   ? ? None  ? ?  ? ? ? ?Note:  This document was prepared using Dragon voice recognition software and may include unintentional dictation errors. ?  ?Shaune Pollack, MD ?02/17/22 0201 ? ?

## 2022-02-19 ENCOUNTER — Encounter: Payer: Self-pay | Admitting: Cardiology

## 2022-02-19 ENCOUNTER — Encounter: Payer: Self-pay | Admitting: *Deleted

## 2022-02-19 ENCOUNTER — Ambulatory Visit (INDEPENDENT_AMBULATORY_CARE_PROVIDER_SITE_OTHER): Payer: BC Managed Care – PPO | Admitting: Cardiology

## 2022-02-19 VITALS — BP 122/70 | HR 98 | Ht 71.0 in | Wt 223.4 lb

## 2022-02-19 DIAGNOSIS — R079 Chest pain, unspecified: Secondary | ICD-10-CM

## 2022-02-19 DIAGNOSIS — R0602 Shortness of breath: Secondary | ICD-10-CM | POA: Diagnosis not present

## 2022-02-19 NOTE — Patient Instructions (Signed)
Medication Instructions:  ? ?Your physician recommends that you continue on your current medications as directed. Please refer to the Current Medication list given to you today. ? ?*If you need a refill on your cardiac medications before your next appointment, please call your pharmacy* ? ? ?Testing/Procedures: ? ?Your physician has requested that you have an echocardiogram. Echocardiography is a painless test that uses sound waves to create images of your heart. It provides your doctor with information about the size and shape of your heart and how well your heart?s chambers and valves are working. This procedure takes approximately one hour. There are no restrictions for this procedure. ? ? ?Your physician has requested that you have en exercise stress myoview. For further information please visit [email or web address] Please follow instruction sheet, as given. ? ? ? ?Follow-Up: ?At [facility], you and your health needs are our priority.  As part of our continuing mission to provide you with exceptional heart care, we have created designated Provider Care Teams.  These Care Teams include your primary Cardiologist (physician) and Advanced Practice Providers (APPs -  Physician Assistants and Nurse Practitioners) who all work together to provide you with the care you need, when you need it. ? ?We recommend signing up for the patient portal called "MyChart".  Sign up information is provided on this After Visit Summary.  MyChart is used to connect with patients for Virtual Visits (Telemedicine).  Patients are able to view lab/test results, encounter notes, upcoming appointments, etc.  Non-urgent messages can be sent to your provider as well.   ?To learn more about what you can do with MyChart, go to [email or web address].   ? ?Your next appointment:   ?6 month(s) ? ?The format for your next appointment:   ?In Person ? ?Provider:   ?Dr. Shari Prows  ? ?Important Information About Sugar ? ? ? ? ?

## 2022-02-19 NOTE — Progress Notes (Signed)
?Cardiology Office Note:   ? ?Date:  02/19/2022  ? ?Ronald Allen, DOB 04/23/1981, MRN [id number] ? ?PCP:  Patient, No Pcp Per (Inactive) ?  ?[facility] Providers ?Cardiologist:  None { ? ? ?Referring MD: No ref. provider found  ? ? ?History of Present Illness:   ? ?Ronald Allen is a 41 y.o. male with no significant PMH who was referred by Dr. Erma Heritage for further evaluation of chest pain and SOB. ? ?Patient was seen in [facility] ER on 02/16/22 for chest pain and SOB. Note reviewed. The patient was driving to work and developed sudden shortness of breath as well as pleuritic chest pain. He drove to the ER for further evaluation. In the ER, ECG nonischemic. Trop negative x2. BNP normal 15.8. CTA PE protocol 02/16/22 with no evidence of PE. He is now referred to Cardiology for further management. ? ?Today, the patient states his symptoms initially developed while exercising at the gym in 2019 after extensive work-out. At that time he felt short winded and had associated diaphoresis. Notably, he was on steroids and was smoking cigarettes and drinking a lot of energy drinks at that time. Despite stopping these, the patient continues to have intermittent episodes of SOB including the most recent episode where he was evaluated in the ER as detailed above. He states that he is not able to exert himself because if his HR goes over 120bpm, he will have recurrence of his SOB and generalized diaphoresis which improves with cold towels. He denies any history of asthma. No orthopnea, PND, palpitations, lightheadedness or dizziness.  ? ? ?Past Medical History:  ?Diagnosis Date  ? Tobacco use   ? ? ?No past surgical history on file. ? ?Current Medications: ?No outpatient medications have been marked as taking for the 02/19/22 encounter (Office Visit) with Meriam Sprague, MD.  ?  ? ?Allergies:   Patient has no known allergies.  ? ?Social History  ? ?Socioeconomic History  ? Marital status: Single  ?  Spouse name: Not on file  ?  Number of children: Not on file  ? Years of education: Not on file  ? Highest education level: Not on file  ?Occupational History  ? Not on file  ?Tobacco Use  ? Smoking status: Former  ?  Packs/day: 1.00  ?  Types: Cigarettes  ? Smokeless tobacco: Never  ? Tobacco comments:  ?  quit a year ago this month  ?Substance and Sexual Activity  ? Alcohol use: Not Currently  ? Drug use: Not Currently  ? Sexual activity: Not on file  ?Other Topics Concern  ? Not on file  ?Social History Narrative  ? Not on file  ? ?Social Determinants of Health  ? ?Financial Resource Strain: Not on file  ?Food Insecurity: Not on file  ?Transportation Needs: Not on file  ?Physical Activity: Not on file  ?Stress: Not on file  ?Social Connections: Not on file  ?  ? ?Family History: ?The patient's family history is not on file. ? ?ROS:   ?Please see the history of present illness.    ?Review of Systems  ?Constitutional:  Positive for diaphoresis. Negative for malaise/fatigue.  ?Respiratory:  Positive for shortness of breath.   ?Cardiovascular:  Positive for chest pain. Negative for palpitations, orthopnea, claudication, leg swelling and PND.  ?Gastrointestinal:  Negative for nausea and vomiting.  ?Genitourinary:  Negative for hematuria.  ?Musculoskeletal:  Negative for falls.  ?Neurological:  Negative for dizziness and loss of consciousness.  ?Psychiatric/Behavioral:  Negative for substance abuse.    ? ?EKGs/Labs/Other Studies Reviewed:   ? ?The following studies were reviewed today: ?CTA PE protocol 02/16/22: ?FINDINGS: ?Cardiovascular: Satisfactory opacification of the bilateral ?pulmonary arteries to the segmental level. No evidence of pulmonary ?embolism. ?  ?Although not tailored for evaluation of the thoracic aorta, there is ?no evidence of thoracic aortic aneurysm or dissection. ?  ?Heart is normal in size.  No pericardial effusion. ?  ?Mediastinum/Nodes: No suspicious mediastinal lymphadenopathy. ?  ?Visualized thyroid is unremarkable. ?   ?Lungs/Pleura: Mild scattered ground-glass opacities as mosaic ?attenuation in the lungs bilaterally. ?  ?No focal consolidation. ?  ?No suspicious pulmonary nodules. ?  ?No pleural effusion or pneumothorax. ?  ?Upper Abdomen: Visualized upper abdomen is grossly unremarkable. ?  ?Musculoskeletal: Visualized osseous structures are within normal ?limits. ?  ?Review of the MIP images confirms the above findings. ?  ?IMPRESSION: ?No evidence of pulmonary embolism. ?  ?Negative CT chest. ? ?EKG:  EKG is  ordered today.  The ekg ordered today demonstrates NSR with HR 98 ? ?Recent Labs: ?02/16/2022: B Natriuretic Peptide 15.8; BUN 17; Creatinine, Ser 0.93; Hemoglobin 16.4; Platelets 250; Potassium 3.6; Sodium 136  ?Recent Lipid Panel ?No results found for: CHOL, TRIG, HDL, CHOLHDL, VLDL, LDLCALC, LDLDIRECT ? ? ?Risk Assessment/Calculations:   ?  ? ?    ? ?Physical Exam:   ? ?VS:  BP 122/70   Pulse 98   Ht 5\' 11"  (1.803 m)   Wt 223 lb 6.4 oz (101.3 kg)   SpO2 98%   BMI 31.16 kg/m?    ? ?Wt Readings from Last 3 Encounters:  ?02/19/22 223 lb 6.4 oz (101.3 kg)  ?02/16/22 220 lb (99.8 kg)  ?02/19/20 208 lb (94.3 kg)  ?  ? ?GEN:  Well nourished, well developed in no acute distress ?HEENT: Normal ?NECK: No JVD; No carotid bruits ?CARDIAC: RRR, no murmurs, rubs, gallops ?RESPIRATORY:  Clear to auscultation without rales, wheezing or rhonchi  ?ABDOMEN: Soft, non-tender, non-distended ?MUSCULOSKELETAL:  No edema; No deformity  ?SKIN: Warm and dry ?NEUROLOGIC:  Alert and oriented x 3 ?PSYCHIATRIC:  Normal affect  ? ?ASSESSMENT:   ? ?1. SOB (shortness of breath)   ?2. Chest pain, unspecified type   ? ?PLAN:   ? ?In order of problems listed above: ? ?#SOB: ?Patient has history of episodes of severe SOB, chest tightness and diaphoresis that usually occur with significant exertion but had one episode while driving in his car prompting ER visit as detailed above. Work-up in the ER reassuring. Trop negative. BNP normal. CTA without  evidence of PE. Unclear etiology. Will check TTE and stress ECG for further evaluation. If reassuring, could consider referral to pulm for further work-up. ?-Check TTE ?-Check exercise treadmill given that symptoms typically occur with exertion and peak activity ? ?   ? ?Shared Decision Making/Informed Consent{ ? ?The risks [chest pain, shortness of breath, cardiac arrhythmias, dizziness, blood pressure fluctuations, myocardial infarction, stroke/transient ischemic attack, and life-threatening complications (estimated to be 1 in 10,000)], benefits (risk stratification, diagnosing coronary artery disease, treatment guidance) and alternatives of an exercise tolerance test were discussed in detail with Ronald Allen and he agrees to proceed.  ? ? ?Medication Adjustments/Labs and Tests Ordered: ?Current medicines are reviewed at length with the patient today.  Concerns regarding medicines are outlined above.  ?Orders Placed This Encounter  ?Procedures  ? Cardiac Stress Test: Informed Consent Details: Physician/Practitioner Attestation; Transcribe to consent form and obtain patient signature  ? MYOCARDIAL  PERFUSION IMAGING  ? EKG 12-Lead  ? ECHOCARDIOGRAM COMPLETE  ? ?No orders of the defined types were placed in this encounter. ? ? ?Patient Instructions  ?Medication Instructions:  ? ?Your physician recommends that you continue on your current medications as directed. Please refer to the Current Medication list given to you today. ? ?*If you need a refill on your cardiac medications before your next appointment, please call your pharmacy* ? ? ?Testing/Procedures: ? ?Your physician has requested that you have an echocardiogram. Echocardiography is a painless test that uses sound waves to create images of your heart. It provides your doctor with information about the size and shape of your heart and how well your heart?s chambers and valves are working. This procedure takes approximately one hour. There are no restrictions for  this procedure. ? ? ?Your physician has requested that you have en exercise stress myoview. For further information please visit [email or web address] Please follow instruction sheet, as given. ? ? ? ?Follow-Up:

## 2022-02-20 DIAGNOSIS — J020 Streptococcal pharyngitis: Secondary | ICD-10-CM | POA: Diagnosis not present

## 2022-02-20 DIAGNOSIS — J029 Acute pharyngitis, unspecified: Secondary | ICD-10-CM | POA: Diagnosis not present

## 2022-02-25 ENCOUNTER — Ambulatory Visit (HOSPITAL_COMMUNITY): Payer: BC Managed Care – PPO

## 2022-02-27 ENCOUNTER — Telehealth (HOSPITAL_COMMUNITY): Payer: Self-pay

## 2022-02-27 NOTE — Telephone Encounter (Signed)
Detailed instructions left on the patient's answering machine. Asked to call back wit any questions. S.Townes Fuhs EMTP 

## 2022-02-28 ENCOUNTER — Encounter (HOSPITAL_COMMUNITY): Payer: Self-pay | Admitting: Cardiology

## 2022-02-28 ENCOUNTER — Ambulatory Visit (HOSPITAL_COMMUNITY): Payer: BC Managed Care – PPO | Attending: Cardiology

## 2022-03-05 ENCOUNTER — Ambulatory Visit (HOSPITAL_COMMUNITY): Payer: BC Managed Care – PPO | Attending: Cardiovascular Disease

## 2022-03-11 ENCOUNTER — Ambulatory Visit: Payer: Commercial Managed Care - PPO | Admitting: Cardiology

## 2022-03-11 ENCOUNTER — Encounter (HOSPITAL_COMMUNITY): Payer: Self-pay | Admitting: Cardiology

## 2022-08-22 NOTE — Progress Notes (Deleted)
Cardiology Office Note:    Date:  08/22/2022   ID:  Ronald Allen, DOB 10-23-1980, MRN [id number]  PCP:  Patient, No Pcp Per   [facility] Providers Cardiologist:  None {   Referring MD: Ronald Bergeron, MD    History of Present Illness:    Ronald Allen is a 41 y.o. male with no significant PMH who presents to clinic for follow-up.  Patient was seen in [facility] ER on 02/16/22 for chest pain and SOB. The patient was driving to work and developed sudden shortness of breath as well as pleuritic chest pain. He drove to the ER for further evaluation. In the ER, ECG nonischemic. Trop negative x2. BNP normal 15.8. CTA PE protocol 02/16/22 with no evidence of PE. Was referred to Cardiology at that time.  Was seen in 02/2022 where he continued to have intermittent episodes of SOB with exercise. We recommended TTE and ETT but they were not performed.  Today, ***    Past Medical History:  Diagnosis Date   Tobacco use     No past surgical history on file.  Current Medications: No outpatient medications have been marked as taking for the 08/23/22 encounter (Appointment) with Ronald Bergeron, MD.     Allergies:   Patient has no known allergies.   Social History   Socioeconomic History   Marital status: Single    Spouse name: Not on file   Number of children: Not on file   Years of education: Not on file   Highest education level: Not on file  Occupational History   Not on file  Tobacco Use   Smoking status: Former    Packs/day: 1.00    Types: Cigarettes   Smokeless tobacco: Never   Tobacco comments:    quit a year ago this month  Substance and Sexual Activity   Alcohol use: Not Currently   Drug use: Not Currently   Sexual activity: Not on file  Other Topics Concern   Not on file  Social History Narrative   Not on file   Social Determinants of Health   Financial Resource Strain: Not on file  Food Insecurity: Not on file  Transportation Needs: Not on file   Physical Activity: Not on file  Stress: Not on file  Social Connections: Not on file     Family History: The patient's family history is not on file.  ROS:   Please see the history of present illness.    Review of Systems  Constitutional:  Positive for diaphoresis. Negative for malaise/fatigue.  Respiratory:  Positive for shortness of breath.   Cardiovascular:  Positive for chest pain. Negative for palpitations, orthopnea, claudication, leg swelling and PND.  Gastrointestinal:  Negative for nausea and vomiting.  Genitourinary:  Negative for hematuria.  Musculoskeletal:  Negative for falls.  Neurological:  Negative for dizziness and loss of consciousness.  Psychiatric/Behavioral:  Negative for substance abuse.      EKGs/Labs/Other Studies Reviewed:    The following studies were reviewed today: CTA PE protocol 02/16/22: FINDINGS: Cardiovascular: Satisfactory opacification of the bilateral pulmonary arteries to the segmental level. No evidence of pulmonary embolism.   Although not tailored for evaluation of the thoracic aorta, there is no evidence of thoracic aortic aneurysm or dissection.   Heart is normal in size.  No pericardial effusion.   Mediastinum/Nodes: No suspicious mediastinal lymphadenopathy.   Visualized thyroid is unremarkable.   Lungs/Pleura: Mild scattered ground-glass opacities as mosaic attenuation in the lungs bilaterally.   No  focal consolidation.   No suspicious pulmonary nodules.   No pleural effusion or pneumothorax.   Upper Abdomen: Visualized upper abdomen is grossly unremarkable.   Musculoskeletal: Visualized osseous structures are within normal limits.   Review of the MIP images confirms the above findings.   IMPRESSION: No evidence of pulmonary embolism.   Negative CT chest.  EKG:  EKG is  ordered today.  The ekg ordered today demonstrates NSR with HR 98  Recent Labs: 02/16/2022: B Natriuretic Peptide 15.8; BUN 17; Creatinine,  Ser 0.93; Hemoglobin 16.4; Platelets 250; Potassium 3.6; Sodium 136  Recent Lipid Panel No results found for: "CHOL", "TRIG", "HDL", "CHOLHDL", "VLDL", "LDLCALC", "LDLDIRECT"   Risk Assessment/Calculations:           Physical Exam:    VS:  There were no vitals taken for this visit.    Wt Readings from Last 3 Encounters:  02/19/22 223 lb 6.4 oz (101.3 kg)  02/16/22 220 lb (99.8 kg)  02/19/20 208 lb (94.3 kg)     GEN:  Well nourished, well developed in no acute distress HEENT: Normal NECK: No JVD; No carotid bruits CARDIAC: RRR, no murmurs, rubs, gallops RESPIRATORY:  Clear to auscultation without rales, wheezing or rhonchi  ABDOMEN: Soft, non-tender, non-distended MUSCULOSKELETAL:  No edema; No deformity  SKIN: Warm and dry NEUROLOGIC:  Alert and oriented x 3 PSYCHIATRIC:  Normal affect   ASSESSMENT:    No diagnosis found.  PLAN:    In order of problems listed above:  #SOB: Patient has history of episodes of severe SOB, chest tightness and diaphoresis that usually occur with significant exertion but had one episode while driving in his car prompting ER visit as detailed above. Work-up in the ER reassuring. Trop negative. BNP normal. CTA without evidence of PE. Unclear etiology. Will check TTE and stress ECG for further evaluation. If reassuring, could consider referral to pulm for further work-up. -Check TTE -Check exercise treadmill given that symptoms typically occur with exertion and peak activity      Shared Decision Making/Informed Consent{  The risks [chest pain, shortness of breath, cardiac arrhythmias, dizziness, blood pressure fluctuations, myocardial infarction, stroke/transient ischemic attack, and life-threatening complications (estimated to be 1 in 10,000)], benefits (risk stratification, diagnosing coronary artery disease, treatment guidance) and alternatives of an exercise tolerance test were discussed in detail with Ronald Allen and he agrees to proceed.     Medication Adjustments/Labs and Tests Ordered: Current medicines are reviewed at length with the patient today.  Concerns regarding medicines are outlined above.  No orders of the defined types were placed in this encounter.  No orders of the defined types were placed in this encounter.   There are no Patient Instructions on file for this visit.    Signed, Meriam Sprague, MD  08/22/2022 3:15 PM    [facility]

## 2022-08-23 ENCOUNTER — Ambulatory Visit: Payer: Self-pay | Attending: Cardiology | Admitting: Cardiology

## 2024-01-08 IMAGING — CT CT ANGIO CHEST
2 of 6 series · 17 of 46 positions shown · IV contrast (APPLIED)
Comparison: Chest radiograph dated 02/16/2022

CLINICAL DATA: Chest pain, shortness of breath

EXAM:
CT ANGIOGRAPHY CHEST WITH CONTRAST
TECHNIQUE: Multidetector CT imaging of the chest was performed using the
standard protocol during bolus administration of intravenous
contrast. Multiplanar CT image reconstructions and MIPs were
obtained to evaluate the vascular anatomy.

[Series 6: thins · axial · 0.68mm/px · z∈[-604,-395]mm · 14 of 329 slices shown]
[im 15/329  lung]
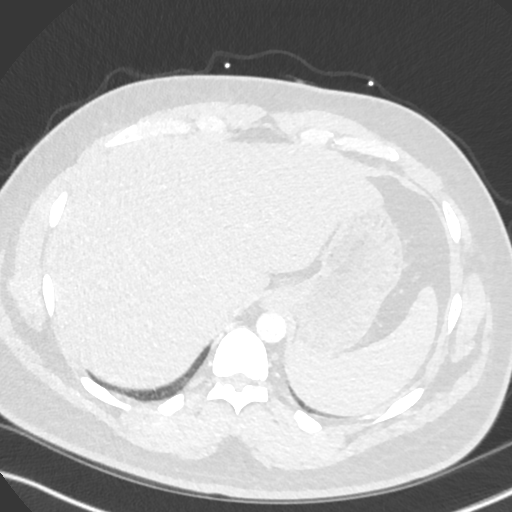
[im 43/329  soft-tissue]
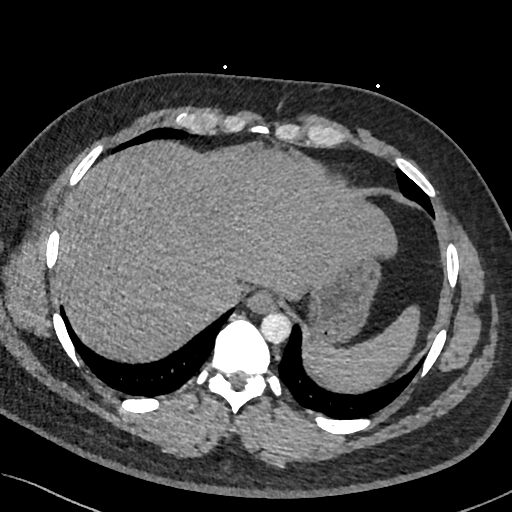
[im 58/329  lung]
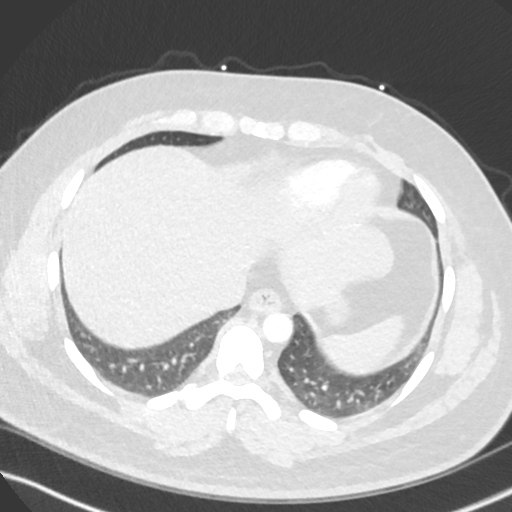
[im 86/329  soft-tissue]
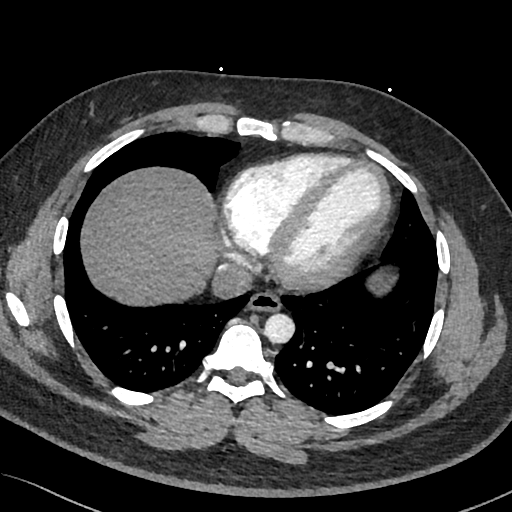
[im 115/329  lung]
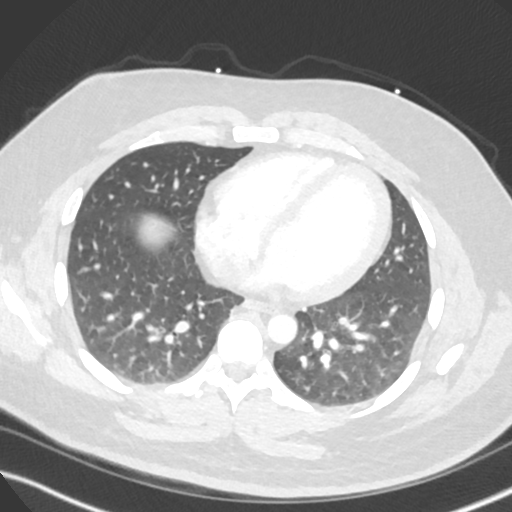
[im 129/329  soft-tissue]
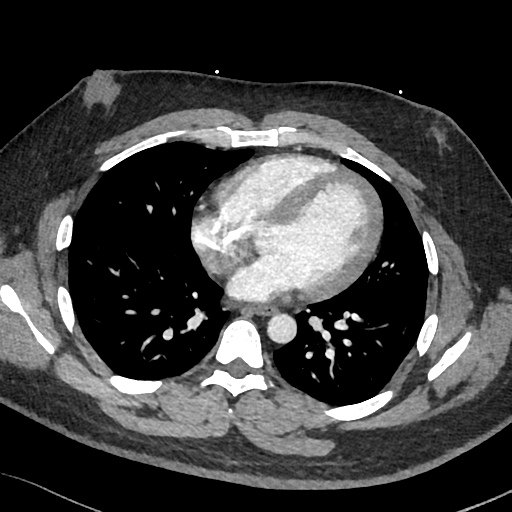
[im 157/329  lung]
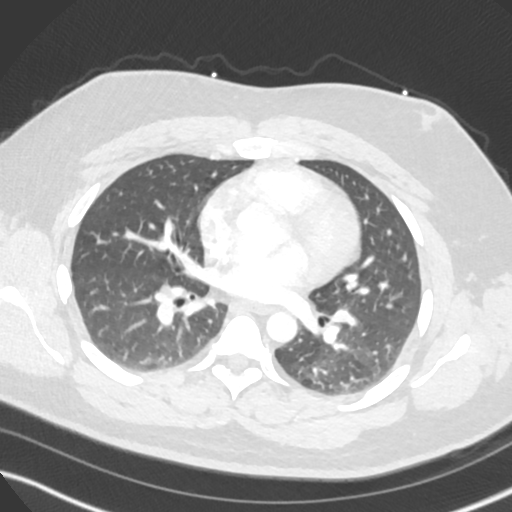
[im 172/329  soft-tissue]
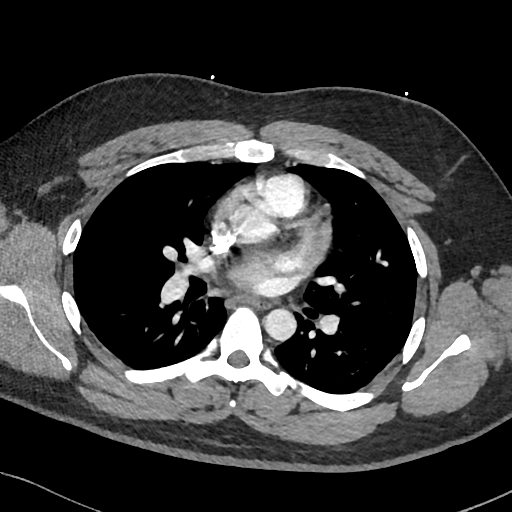
[im 200/329  lung]
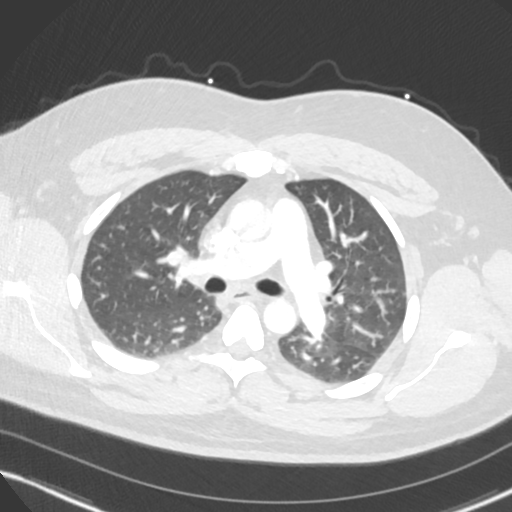
[im 214/329  soft-tissue]
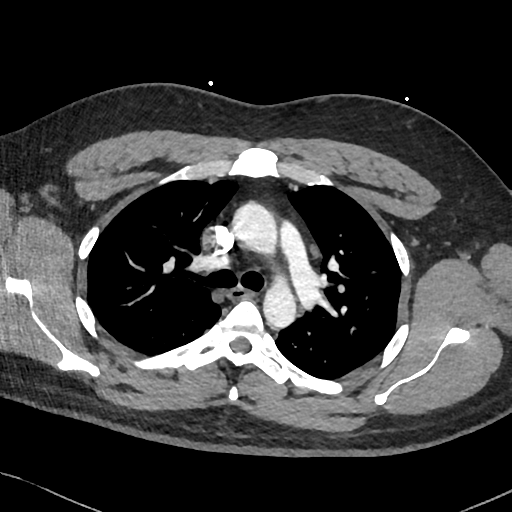
[im 243/329  lung]
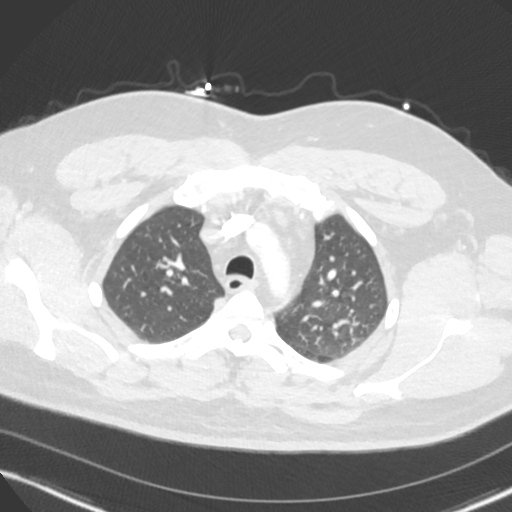
[im 271/329  soft-tissue]
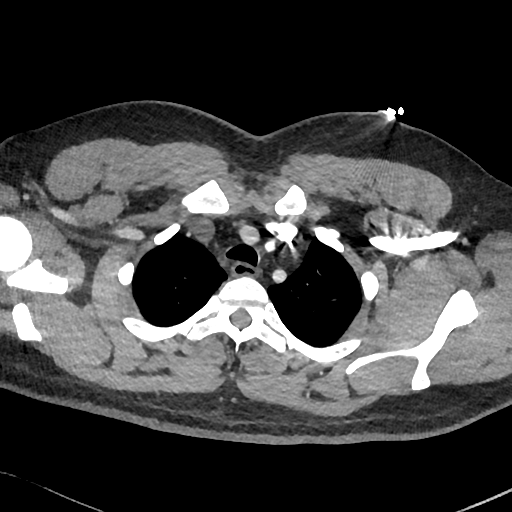
[im 286/329  lung]
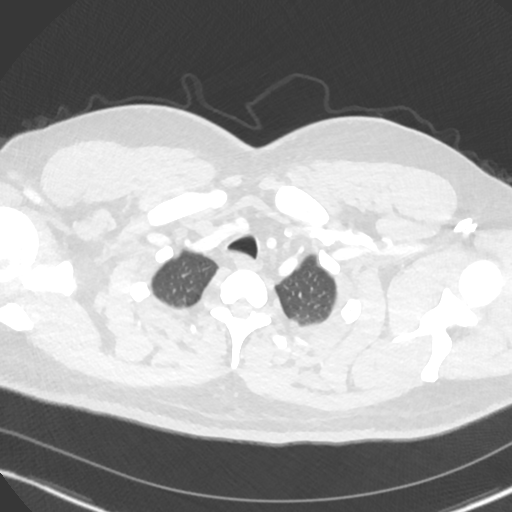
[im 314/329  soft-tissue]
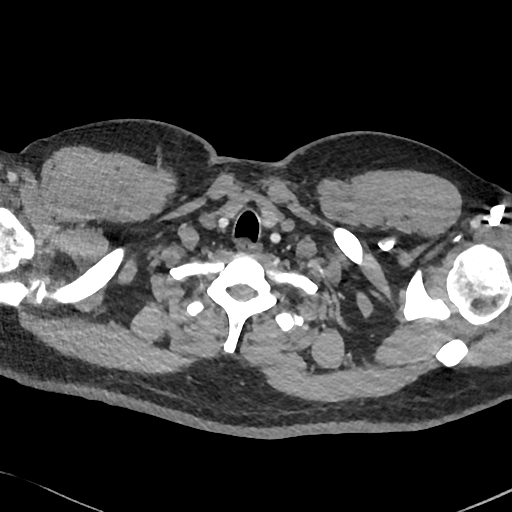

[Series 7: cor · coronal · 0.51mm/px · 3 of 144 slices shown]
[im 36/144  soft-tissue]
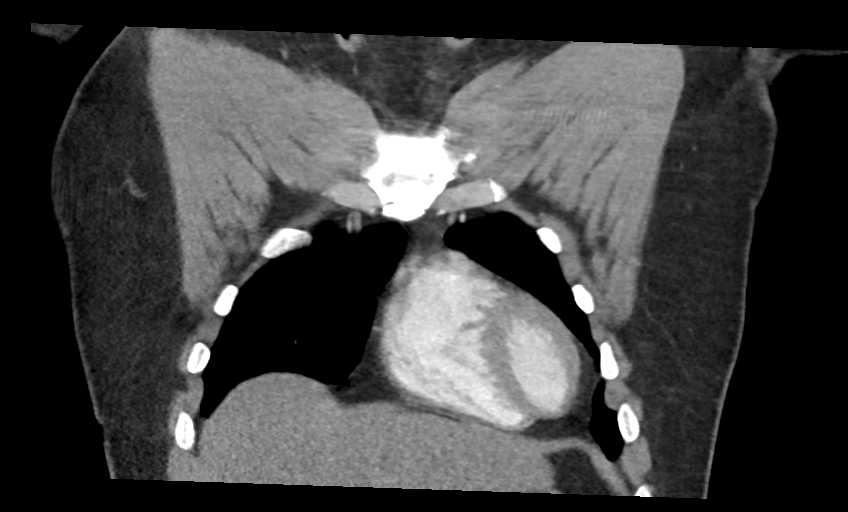
[im 72/144  soft-tissue]
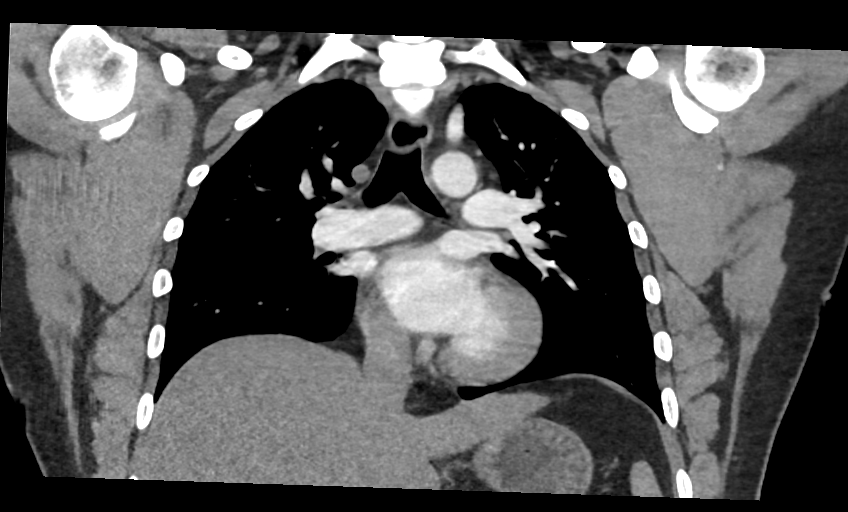
[im 108/144  soft-tissue]
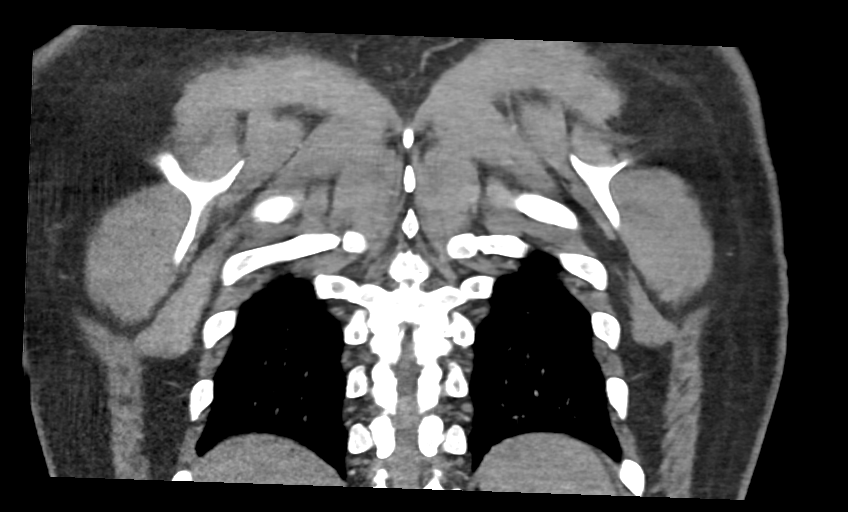

[17 of 46 positions shown; findings below may reference images not displayed]

RADIATION DOSE REDUCTION: This exam was performed according to the
departmental dose-optimization program which includes automated
exposure control, adjustment of the mA and/or kV according to
patient size and/or use of iterative reconstruction technique.

CONTRAST:  50mL OMNIPAQUE IOHEXOL 350 MG/ML SOLN
FINDINGS: Cardiovascular: Satisfactory opacification of the bilateral
pulmonary arteries to the segmental level. No evidence of pulmonary
embolism.

Although not tailored for evaluation of the thoracic aorta, there is
no evidence of thoracic aortic aneurysm or dissection.

Heart is normal in size.  No pericardial effusion.

Mediastinum/Nodes: No suspicious mediastinal lymphadenopathy.

Visualized thyroid is unremarkable.

Lungs/Pleura: Mild scattered ground-glass opacities as mosaic
attenuation in the lungs bilaterally.

No focal consolidation.

No suspicious pulmonary nodules.

No pleural effusion or pneumothorax.

Upper Abdomen: Visualized upper abdomen is grossly unremarkable.

Musculoskeletal: Visualized osseous structures are within normal
limits.

Review of the MIP images confirms the above findings.
IMPRESSION: No evidence of pulmonary embolism.

Negative CT chest.

## 2024-01-08 IMAGING — CR DG CHEST 2V
1 series · 2 of 2 positions shown · non-contrast
Comparison: 08/10/2019 and prior chest radiographs

CLINICAL DATA: Acute shortness of breath and chest pain today.

EXAM:
CHEST - 2 VIEW

[Series 1: dg chest 2 view · 0.14mm/px · 2 of 2 slices shown]
[im 1/2]
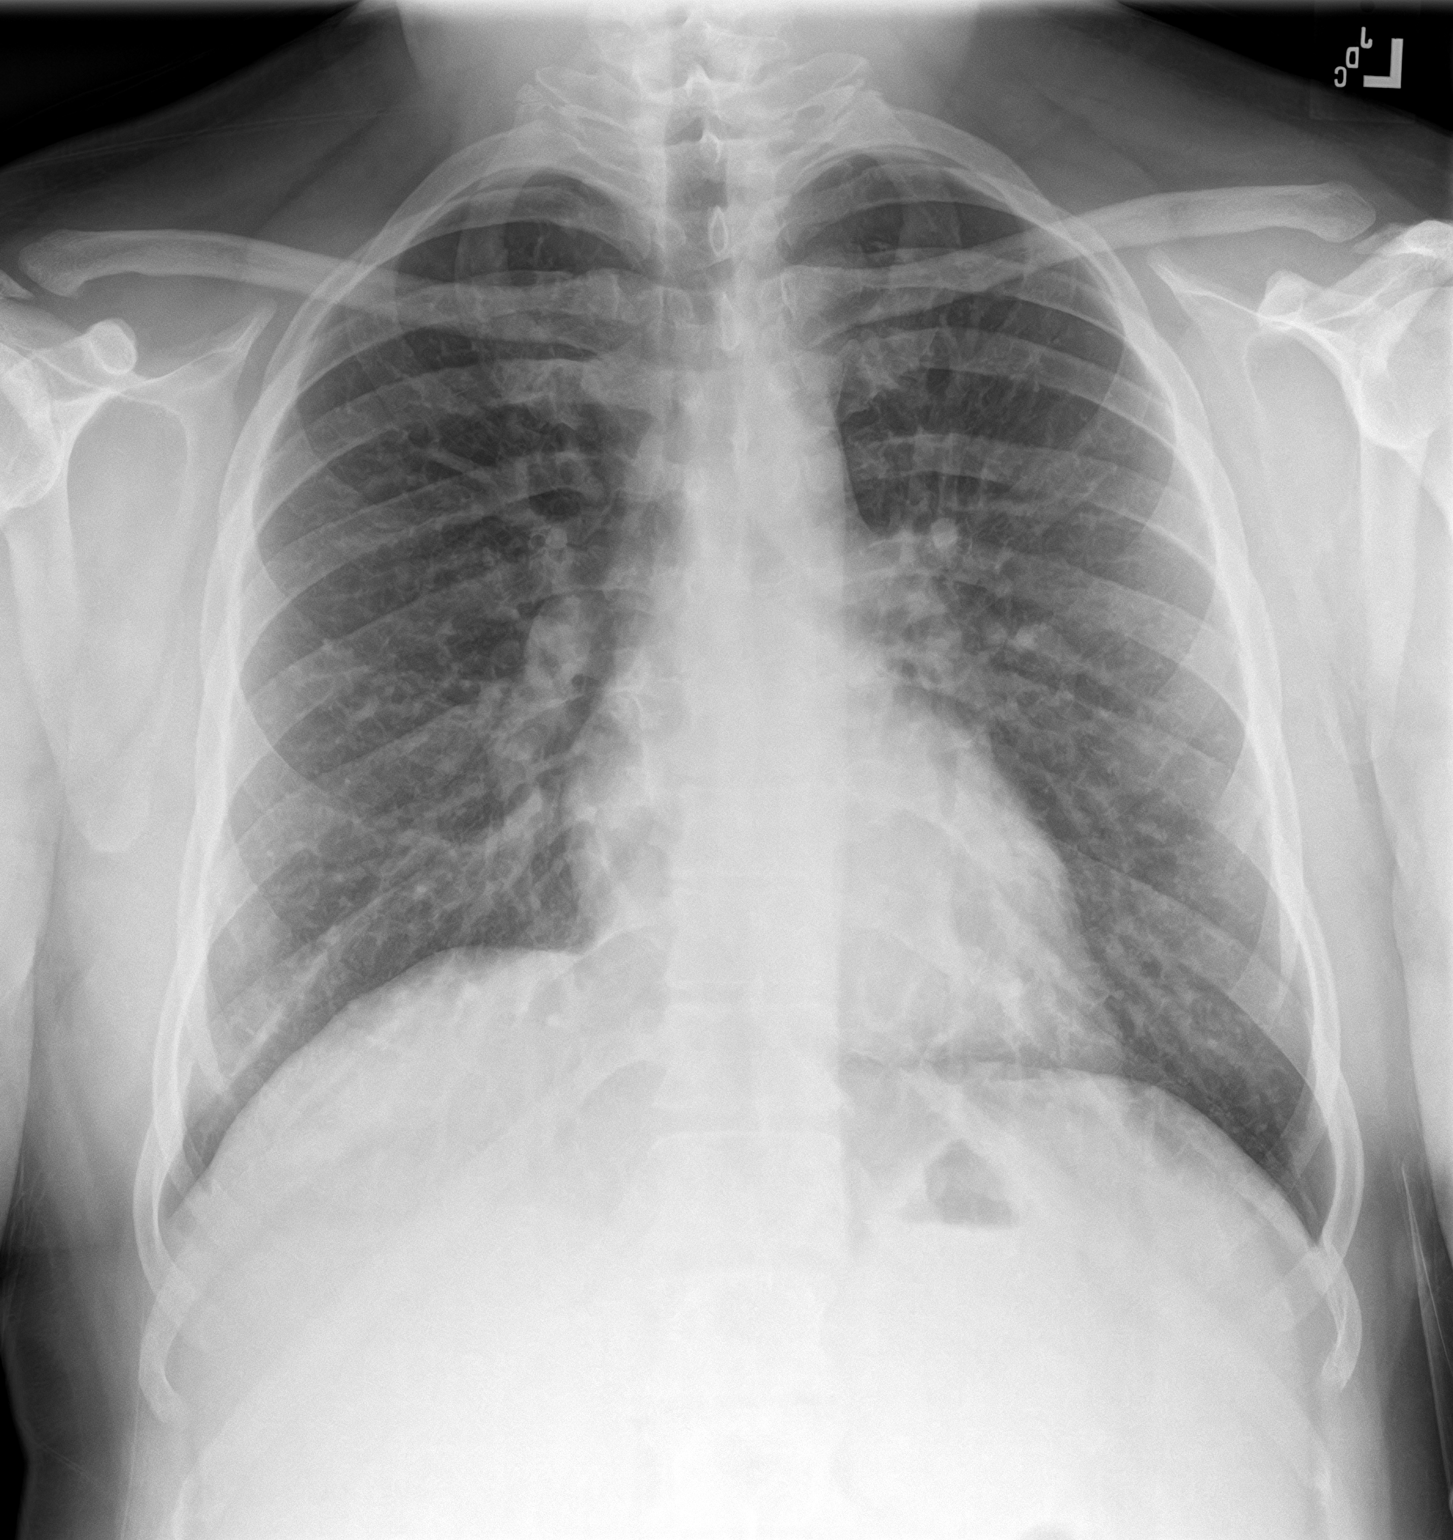
[im 2/2]
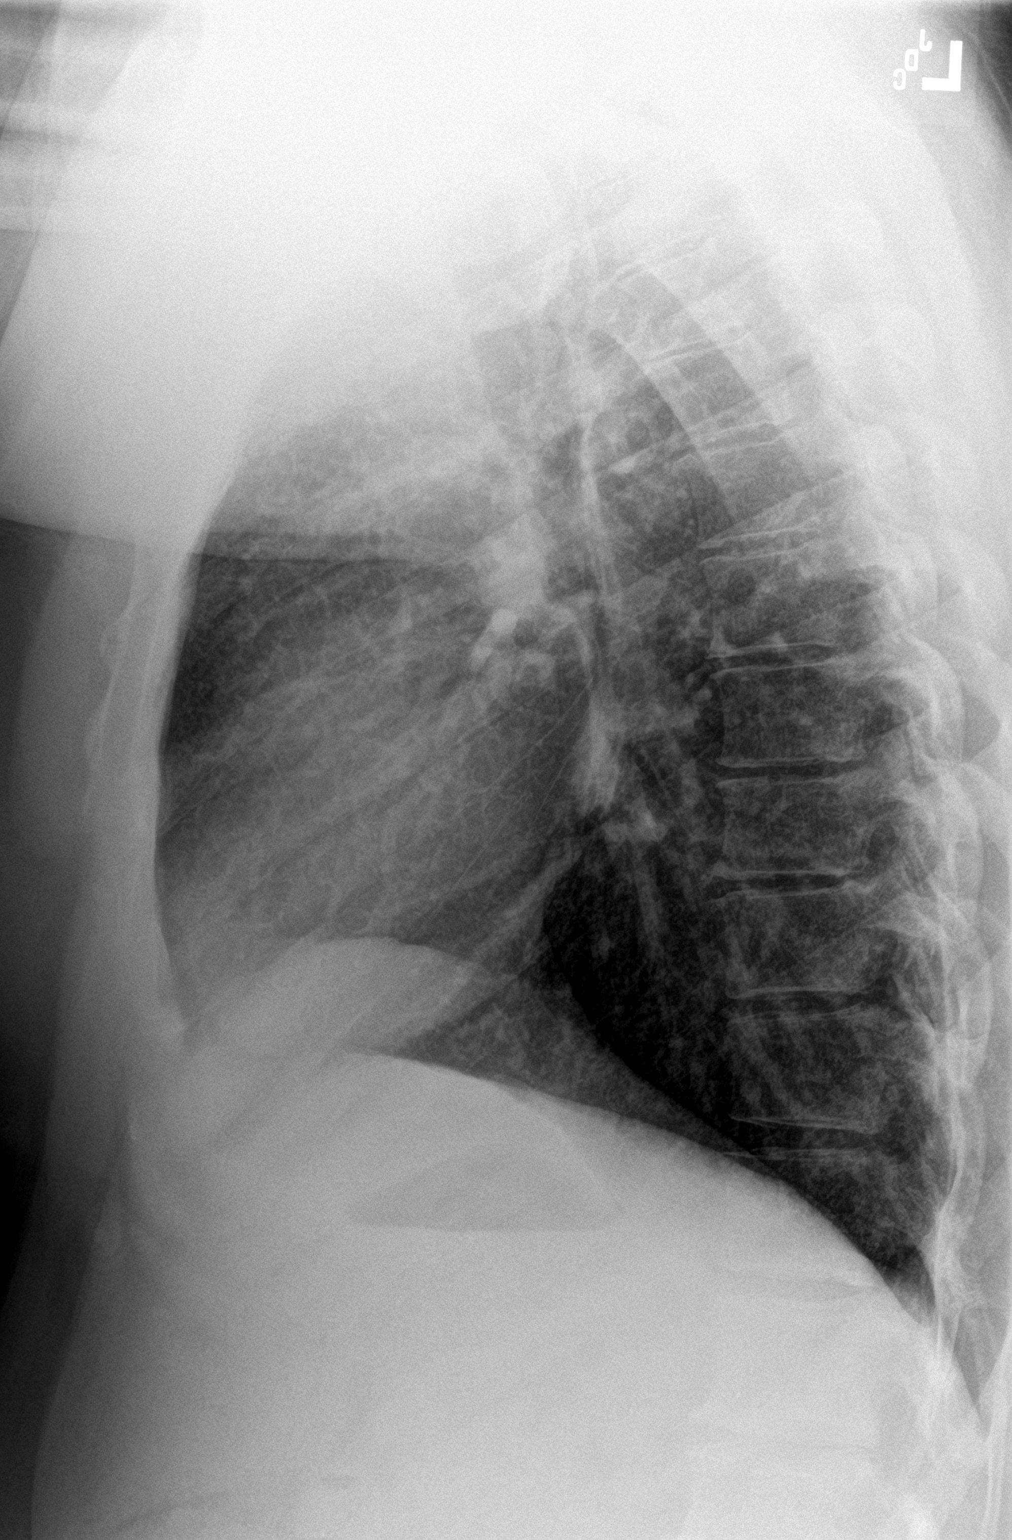

[2 of 2 positions shown; findings below may reference images not displayed]

FINDINGS: The cardiomediastinal silhouette is unremarkable.

Mild RIGHT hemidiaphragm elevation and mild peribronchial thickening
are again noted.

There is no evidence of focal airspace disease, pulmonary edema,
suspicious pulmonary nodule/mass, pleural effusion, or pneumothorax.

No acute bony abnormalities are identified.
IMPRESSION: No active cardiopulmonary disease.
